# Patient Record
Sex: Female | Born: 1940 | Race: Black or African American | Hispanic: No | Smoking: Former smoker
Health system: Southern US, Community
[De-identification: ages and names within clinical notes are randomized; demographics above are authoritative.]

## PROBLEM LIST (undated history)

## (undated) DIAGNOSIS — E119 Type 2 diabetes mellitus without complications: Secondary | ICD-10-CM

## (undated) DIAGNOSIS — N289 Disorder of kidney and ureter, unspecified: Secondary | ICD-10-CM

## (undated) DIAGNOSIS — R519 Headache, unspecified: Secondary | ICD-10-CM

## (undated) DIAGNOSIS — I480 Paroxysmal atrial fibrillation: Secondary | ICD-10-CM

## (undated) DIAGNOSIS — I10 Essential (primary) hypertension: Secondary | ICD-10-CM

## (undated) HISTORY — DX: Headache, unspecified: R51.9

## (undated) HISTORY — PX: CHOLECYSTECTOMY: SHX55

## (undated) HISTORY — PX: PACEMAKER INSERTION: SHX728

---

## 2016-02-02 ENCOUNTER — Encounter (HOSPITAL_BASED_OUTPATIENT_CLINIC_OR_DEPARTMENT_OTHER): Payer: Self-pay | Admitting: Emergency Medicine

## 2016-02-02 ENCOUNTER — Emergency Department (HOSPITAL_BASED_OUTPATIENT_CLINIC_OR_DEPARTMENT_OTHER)
Admission: EM | Admit: 2016-02-02 | Discharge: 2016-02-02 | Disposition: A | Discharge status: Home / Self Care | Payer: Medicare HMO | Attending: Emergency Medicine | Admitting: Emergency Medicine

## 2016-02-02 DIAGNOSIS — E119 Type 2 diabetes mellitus without complications: Secondary | ICD-10-CM | POA: Insufficient documentation

## 2016-02-02 DIAGNOSIS — R05 Cough: Secondary | ICD-10-CM

## 2016-02-02 DIAGNOSIS — R0981 Nasal congestion: Secondary | ICD-10-CM | POA: Diagnosis not present

## 2016-02-02 DIAGNOSIS — I10 Essential (primary) hypertension: Secondary | ICD-10-CM | POA: Diagnosis not present

## 2016-02-02 DIAGNOSIS — Z87891 Personal history of nicotine dependence: Secondary | ICD-10-CM | POA: Diagnosis not present

## 2016-02-02 DIAGNOSIS — J3489 Other specified disorders of nose and nasal sinuses: Secondary | ICD-10-CM

## 2016-02-02 DIAGNOSIS — R197 Diarrhea, unspecified: Secondary | ICD-10-CM | POA: Diagnosis not present

## 2016-02-02 DIAGNOSIS — R059 Cough, unspecified: Secondary | ICD-10-CM

## 2016-02-02 HISTORY — DX: Disorder of kidney and ureter, unspecified: N28.9

## 2016-02-02 HISTORY — DX: Essential (primary) hypertension: I10

## 2016-02-02 HISTORY — DX: Type 2 diabetes mellitus without complications: E11.9

## 2016-02-02 NOTE — ED Notes (Signed)
EDP at BS, pt seen by EDP prior to RN assessment, see MD notes, pending orders. 

## 2016-02-02 NOTE — ED Triage Notes (Signed)
Pt in c/o diarrhea and "raw throat", states thinks she has a fever but hasn't checked. Pt alert, interactive, ambulatory in NAD.

## 2016-02-02 NOTE — ED Provider Notes (Signed)
[facility] Provider Note   CSN: [id number] Arrival date & time: 02/02/16  1819   By signing my name below, I, Arianna Nassar, attest that this documentation has been prepared under the direction and in the presence of Nira Conn, MD.  Electronically Signed: Octavia Heir, ED Scribe. 02/02/16. 7:58 PM.   History   Chief Complaint Chief Complaint  Patient presents with  . Diarrhea    The history is provided by the patient. No language interpreter was used.   HPI Comments: Summer Harmon is a 75 y.o. female who presents to the Emergency Department complaining of sudden onset, gradual worsening, moderate diarrhea (x4) onset this morning. She notes associated sore throat, productive cough and congestion x 1 day. She further notes shortness of breath which she notes feels as if she has to catch her breath. Pt expresses that she feels very achy. She notes her symptoms are worse when she lays down. Pt says she had abdominal pain but reports it went away after using the bathroom. She says she has been having multiple bowel movements today which have fluctuated between loose and normal BMs.  Pt has been around her grandchildren and husband who have been sick. She denies hx of asthma, ear pain, fever, dysuria, nausea, vomiting or melena. Pt is a former smoker and smoked for about 5 years.   Past Medical History:  Diagnosis Date  . Diabetes mellitus without complication (HCC)   . Hypertension   . Renal disorder     There are no active problems to display for this patient.   Past Surgical History:  Procedure Laterality Date  . CHOLECYSTECTOMY    . PACEMAKER INSERTION      OB History    No data available       Home Medications    Prior to Admission medications   Not on File    Family History History reviewed. No pertinent family history.  Social History Social History  Substance Use Topics  . Smoking status: Former Games developer  . Smokeless tobacco:  Not on file  . Alcohol use No     Allergies   Penicillins and Sulfa antibiotics   Review of Systems Review of Systems  A complete 10 system review of systems was obtained and all systems are negative except as noted in the HPI and PMH.   Physical Exam Updated Vital Signs BP 140/76   Pulse 71   Temp 98.7 F (37.1 C)   Resp 18   Ht 5\' 3"  (1.6 m)   Wt 193 lb (87.5 kg)   SpO2 97%   BMI 34.19 kg/m   Physical Exam  Constitutional: She is oriented to person, place, and time. She appears well-developed and well-nourished. No distress.  HENT:  Head: Normocephalic and atraumatic.  Nose: Nose normal.  Post nasal drip Bilateral nasal mucosa edema Mild erythema to oropharynx  Eyes: Conjunctivae and EOM are normal. Pupils are equal, round, and reactive to light. Right eye exhibits no discharge. Left eye exhibits no discharge. No scleral icterus.  Neck: Normal range of motion. Neck supple.  Cardiovascular: Normal rate and regular rhythm.  Exam reveals no gallop and no friction rub.   No murmur heard. Pulmonary/Chest: Effort normal and breath sounds normal. No stridor. No respiratory distress. She has no rales.  Abdominal: Soft. She exhibits no distension. There is no tenderness.  Musculoskeletal: She exhibits no edema or tenderness.  Neurological: She is alert and oriented to person, place, and time.  Skin: Skin  is warm and dry. No rash noted. She is not diaphoretic. No erythema.  Psychiatric: She has a normal mood and affect.  Nursing note and vitals reviewed.    ED Treatments / Results  DIAGNOSTIC STUDIES: Oxygen Saturation is 97% on RA, normal by my interpretation.  COORDINATION OF CARE:  7:54 PM Discussed treatment plan with pt at bedside and pt agreed to plan.  Labs (all labs ordered are listed, but only abnormal results are displayed) Labs Reviewed - No data to display  EKG  EKG Interpretation None       Radiology No results found.  Procedures Procedures  (including critical care time)  Medications Ordered in ED Medications - No data to display   Initial Impression / Assessment and Plan / ED Course  I have reviewed the triage vital signs and the nursing notes.  Pertinent labs & imaging results that were available during my care of the patient were reviewed by me and considered in my medical decision making (see chart for details).  Clinical Course    75 y.o. female presents with cough, rhinorrhea, and nasal congestion for 1 day. Adequate oral hydration. Pt also with complains of fluctuating bowel habits. Rest of history as above.  Patient appears well. No signs of toxicity. No hypoxia, tachypnea or other signs of respiratory distress. No sign of clinical dehydration. Lung exam clear. Abd benign. Rest of exam as above.  Respiratory symptoms most consistent with viral upper respiratory infection.   No evidence suggestive of pharyngitis, AOM, PNA, or meningitis.  Chest x-ray not indicated at this time.  Bowel habit changes, not consistent with infectious diarrhea. abd benign. Low suspicion for appendicitis, diverticulitis, SBO.  Discussed symptomatic treatment with the parents and they will follow closely with their PCP.     I personally performed the services described in this documentation, which was scribed in my presence. The recorded information has been reviewed and is accurate.      Final Clinical Impressions(s) / ED Diagnoses   Final diagnoses:  Diarrhea, unspecified type  Nasal congestion  Cough  Rhinorrhea   Disposition: Discharge  Condition: Good  I have discussed the results, Dx and Tx plan with the patient who expressed understanding and agree(s) with the plan. Discharge instructions discussed at great length. The patient was given strict return precautions who verbalized understanding of the instructions. No further questions at time of discharge.    There are no discharge medications for this  patient.   Follow Up: Areatha KeasAdrian Vazquez, MD [address] [place] [id number] [phone]  Schedule an appointment as soon as possible for a visit  If symptoms do not improve or  worsen in 3-5 days       Nira ConnPedro Eduardo Kallen Delatorre, MD 02/06/16 [phone]

## 2016-02-08 ENCOUNTER — Telehealth (HOSPITAL_BASED_OUTPATIENT_CLINIC_OR_DEPARTMENT_OTHER): Payer: Self-pay | Admitting: *Deleted

## 2016-02-08 NOTE — Telephone Encounter (Signed)
Fax received from [facility] of [facility] [place] that pt had declined to schedule appointment because office was too far. Chart reviewed by Dr. Judd Lienelo and pt was to follow up with her PCP. Spoke with pt by phone and found that her PCP had a similar name and [place]. Advised pt to f/u as needed and chart updated to reflect correct provider information.

## 2018-09-25 ENCOUNTER — Emergency Department (HOSPITAL_BASED_OUTPATIENT_CLINIC_OR_DEPARTMENT_OTHER)
Admission: EM | Admit: 2018-09-25 | Discharge: 2018-09-25 | Disposition: A | Discharge status: Home / Self Care | Payer: Medicare HMO | Attending: Emergency Medicine | Admitting: Emergency Medicine

## 2018-09-25 ENCOUNTER — Emergency Department (HOSPITAL_BASED_OUTPATIENT_CLINIC_OR_DEPARTMENT_OTHER): Payer: Medicare HMO

## 2018-09-25 ENCOUNTER — Encounter (HOSPITAL_BASED_OUTPATIENT_CLINIC_OR_DEPARTMENT_OTHER): Payer: Self-pay

## 2018-09-25 ENCOUNTER — Other Ambulatory Visit: Payer: Self-pay

## 2018-09-25 DIAGNOSIS — Z20828 Contact with and (suspected) exposure to other viral communicable diseases: Secondary | ICD-10-CM | POA: Diagnosis not present

## 2018-09-25 DIAGNOSIS — N289 Disorder of kidney and ureter, unspecified: Secondary | ICD-10-CM | POA: Diagnosis not present

## 2018-09-25 DIAGNOSIS — Z87891 Personal history of nicotine dependence: Secondary | ICD-10-CM | POA: Diagnosis not present

## 2018-09-25 DIAGNOSIS — Z79899 Other long term (current) drug therapy: Secondary | ICD-10-CM | POA: Insufficient documentation

## 2018-09-25 DIAGNOSIS — E876 Hypokalemia: Secondary | ICD-10-CM | POA: Diagnosis not present

## 2018-09-25 DIAGNOSIS — R42 Dizziness and giddiness: Secondary | ICD-10-CM | POA: Diagnosis present

## 2018-09-25 DIAGNOSIS — I10 Essential (primary) hypertension: Secondary | ICD-10-CM | POA: Insufficient documentation

## 2018-09-25 DIAGNOSIS — E871 Hypo-osmolality and hyponatremia: Secondary | ICD-10-CM | POA: Diagnosis not present

## 2018-09-25 DIAGNOSIS — R110 Nausea: Secondary | ICD-10-CM | POA: Insufficient documentation

## 2018-09-25 DIAGNOSIS — E119 Type 2 diabetes mellitus without complications: Secondary | ICD-10-CM | POA: Diagnosis not present

## 2018-09-25 DIAGNOSIS — E860 Dehydration: Secondary | ICD-10-CM | POA: Diagnosis not present

## 2018-09-25 LAB — COMPREHENSIVE METABOLIC PANEL
ALT: 15 U/L (ref 0–44)
AST: 22 U/L (ref 15–41)
Albumin: 4.4 g/dL (ref 3.5–5.0)
Alkaline Phosphatase: 66 U/L (ref 38–126)
Anion gap: 11 (ref 5–15)
BUN: 20 mg/dL (ref 8–23)
CO2: 29 mmol/L (ref 22–32)
Calcium: 10.1 mg/dL (ref 8.9–10.3)
Chloride: 91 mmol/L — ABNORMAL LOW (ref 98–111)
Creatinine, Ser: 1.67 mg/dL — ABNORMAL HIGH (ref 0.44–1.00)
GFR calc Af Amer: 33 mL/min — ABNORMAL LOW (ref >60)
GFR calc non Af Amer: 29 mL/min — ABNORMAL LOW (ref >60)
Glucose, Bld: 104 mg/dL — ABNORMAL HIGH (ref 70–99)
Potassium: 3.1 mmol/L — ABNORMAL LOW (ref 3.5–5.1)
Sodium: 131 mmol/L — ABNORMAL LOW (ref 135–145)
Total Bilirubin: 1.1 mg/dL (ref 0.3–1.2)
Total Protein: 7.9 g/dL (ref 6.5–8.1)

## 2018-09-25 LAB — CBC WITH DIFFERENTIAL/PLATELET
Abs Immature Granulocytes: 0.01 10*3/uL (ref 0.00–0.07)
Basophils Absolute: 0 10*3/uL (ref 0.0–0.1)
Basophils Relative: 1 %
Eosinophils Absolute: 0.1 10*3/uL (ref 0.0–0.5)
Eosinophils Relative: 1 %
HCT: 38.7 % (ref 36.0–46.0)
Hemoglobin: 12.7 g/dL (ref 12.0–15.0)
Immature Granulocytes: 0 %
Lymphocytes Relative: 42 %
Lymphs Abs: 1.7 10*3/uL (ref 0.7–4.0)
MCH: 27.9 pg (ref 26.0–34.0)
MCHC: 32.8 g/dL (ref 30.0–36.0)
MCV: 85.1 fL (ref 80.0–100.0)
Monocytes Absolute: 0.5 10*3/uL (ref 0.1–1.0)
Monocytes Relative: 13 %
Neutro Abs: 1.7 10*3/uL (ref 1.7–7.7)
Neutrophils Relative %: 43 %
Platelets: 313 10*3/uL (ref 150–400)
RBC: 4.55 MIL/uL (ref 3.87–5.11)
RDW: 14 % (ref 11.5–15.5)
WBC: 4 10*3/uL (ref 4.0–10.5)
nRBC: 0 % (ref 0.0–0.2)

## 2018-09-25 LAB — URINALYSIS, ROUTINE W REFLEX MICROSCOPIC
Bilirubin Urine: NEGATIVE
Glucose, UA: NEGATIVE mg/dL
Ketones, ur: NEGATIVE mg/dL
Leukocytes,Ua: NEGATIVE
Nitrite: NEGATIVE
Protein, ur: NEGATIVE mg/dL
Specific Gravity, Urine: <1.005 — ABNORMAL LOW (ref 1.005–1.030)
pH: 6.5 (ref 5.0–8.0)

## 2018-09-25 LAB — URINALYSIS, MICROSCOPIC (REFLEX)

## 2018-09-25 LAB — CBG MONITORING, ED: Glucose-Capillary: 102 mg/dL — ABNORMAL HIGH (ref 70–99)

## 2018-09-25 MED ORDER — POTASSIUM CHLORIDE CRYS ER 20 MEQ PO TBCR
40.0000 meq | EXTENDED_RELEASE_TABLET | Freq: Once | ORAL | Status: AC
  Administered 2018-09-25: 40 meq via ORAL
  Filled 2018-09-25: qty 2

## 2018-09-25 NOTE — ED Triage Notes (Signed)
Pt presents with intermittent HA/ dizziness/weakness x 1 month. Pt ambulatory to triage. Pt denies SOB-

## 2018-09-25 NOTE — ED Notes (Signed)
Patient transported to X-ray and returned 

## 2018-09-25 NOTE — ED Provider Notes (Signed)
[facility] EMERGENCY DEPARTMENT Provider Note   CSN: [id number] Arrival date & time: 09/25/18  1657    History   Chief Complaint Chief Complaint  Patient presents with  . Dizziness    HPI Summer Harmon is a 78 y.o. female.     The history is provided by the patient and medical records. No language interpreter was used.  Dizziness   Summer Harmon is a 78 y.o. female who presents to the Emergency Department complaining of chills, malaise.  She complains of feeling poorly for the last month with chills and feeling hot.  She has checked her temperature and denies fever.  Denies chest pain, sob, abdominal pain, vomiting, dysuria, diarrhea. She has some headaches at night when she lies down but none currently.  She has increased fatigue.  She has a cough, worse at night.  She is nauseous, constipated.  She thinks she has coronavirus.  She has no known exposures to the virus.  She has lost weight, but not sure how much or over how much time.  She lives alone.  Sxs are mild to moderate, worsening.   Past Medical History:  Diagnosis Date  . Diabetes mellitus without complication (HCC)   . Hypertension   . Renal disorder     There are no active problems to display for this patient.   Past Surgical History:  Procedure Laterality Date  . CHOLECYSTECTOMY    . PACEMAKER INSERTION       OB History   No obstetric history on file.      Home Medications    Prior to Admission medications   Medication Sig Start Date End Date Taking? Authorizing Provider  cloNIDine (CATAPRES) 0.2 MG tablet Take 0.2 mg by mouth 3 (three) times daily.   Yes [provider]  lovastatin (MEVACOR) 10 MG tablet Take 40 mg by mouth daily.   Yes [provider]    Family History No family history on file.  Social History Social History   Tobacco Use  . Smoking status: Former Games developer  . Smokeless tobacco: Never Used  Substance Use Topics  . Alcohol use: No  . Drug  use: Never     Allergies   Penicillins and Sulfa antibiotics   Review of Systems Review of Systems  Neurological: Positive for dizziness.  All other systems reviewed and are negative.    Physical Exam Updated Vital Signs BP (!) 152/82   Pulse 76   Temp 98.5 F (36.9 C) (Oral)   Resp 17   SpO2 99%   Physical Exam Vitals signs and nursing note reviewed.  Constitutional:      Appearance: She is well-developed.  HENT:     Head: Normocephalic and atraumatic.  Cardiovascular:     Rate and Rhythm: Normal rate and regular rhythm.     Heart sounds: No murmur.  Pulmonary:     Effort: Pulmonary effort is normal. No respiratory distress.     Breath sounds: Normal breath sounds.  Abdominal:     Palpations: Abdomen is soft.     Tenderness: There is no abdominal tenderness. There is no guarding or rebound.  Musculoskeletal:        General: No tenderness.     Comments: Trace non pitting edema  Skin:    General: Skin is warm and dry.     Capillary Refill: Capillary refill takes less than 2 seconds.  Neurological:     Mental Status: She is alert and oriented to person, place, and  time.  Psychiatric:        Behavior: Behavior normal.      ED Treatments / Results  Labs (all labs ordered are listed, but only abnormal results are displayed) Labs Reviewed  URINALYSIS, ROUTINE W REFLEX MICROSCOPIC - Abnormal; Notable for the following components:      Result Value   Specific Gravity, Urine <1.005 (*)    Hgb urine dipstick TRACE (*)    All other components within normal limits  COMPREHENSIVE METABOLIC PANEL - Abnormal; Notable for the following components:   Sodium 131 (*)    Potassium 3.1 (*)    Chloride 91 (*)    Glucose, Bld 104 (*)    Creatinine, Ser 1.67 (*)    GFR calc non Af Amer 29 (*)    GFR calc Af Amer 33 (*)    All other components within normal limits  URINALYSIS, MICROSCOPIC (REFLEX) - Abnormal; Notable for the following components:   Bacteria, UA RARE (*)     All other components within normal limits  CBG MONITORING, ED - Abnormal; Notable for the following components:   Glucose-Capillary 102 (*)    All other components within normal limits  NOVEL CORONAVIRUS, NAA (HOSPITAL ORDER, SEND-OUT TO REF LAB)  CBC WITH DIFFERENTIAL/PLATELET    EKG EKG Interpretation  Date/Time:  Saturday Sep 25 2018 18:18:58 EDT Ventricular Rate:  77 PR Interval:    QRS Duration: 131 QT Interval:  425 QTC Calculation: 481 R Axis:   3 Text Interpretation:  Sinus rhythm Right atrial enlargement Left ventricular hypertrophy Artifact in lead(s) I III aVR aVL V1 V2 no prior available for comparison Confirmed by Tilden Fossa [phone]) on 09/25/2018 6:59:27 PM   Radiology Dg Chest 2 View  Result Date: 09/25/2018 CLINICAL DATA:  One month history of intermittent headaches associated with dizziness and generalized weakness. Current history of hypertension and diabetes. EXAM: CHEST - 2 VIEW COMPARISON:  None. FINDINGS: Cardiac silhouette normal in size. LEFT subclavian dual lead transvenous pacemaker with the lead tips at the expected location of the RIGHT atrial appendage and RIGHT ventricular apex. Thoracic aorta mildly tortuous. Hilar and mediastinal contours otherwise unremarkable. Lungs clear. Bronchovascular markings normal. Pulmonary vascularity normal. No visible pleural effusions. No pneumothorax. Degenerative changes throughout the thoracic spine. IMPRESSION: No acute cardiopulmonary disease. Electronically Signed   By: Hulan Saas M.D.   On: 09/25/2018 19:55    Procedures Procedures (including critical care time)  Medications Ordered in ED Medications  potassium chloride SA (K-DUR) CR tablet 40 mEq (has no administration in time range)     Initial Impression / Assessment and Plan / ED Course  I have reviewed the triage vital signs and the nursing notes.  Pertinent labs & imaging results that were available during my care of the patient were  reviewed by me and considered in my medical decision making (see chart for details).        Pt here for evaluation of nausea, fatigue and malaise. She is non-toxic appearing on evaluation with no acute distress. She is concerned that she has coronavirus infection but she has no known sick contacts, current constellation of symptoms is not consistent with coronavirus. Will send testing per patient request given her comorbidities. Current presentation is not consistent with ACS, bowel obstruction, CVA, pneumonia, UTI, acute CHF. Labs demonstrate stable renal insufficiency with new hyponatremia, hypokalemia. Discussed with patient oral fluid hydration, she is hydrating in the emergency department. Discussed importance of outpatient follow-up as well as return precautions.  Summer Harmon was evaluated in Emergency Department on 09/25/2018 for the symptoms described in the history of present illness. She was evaluated in the context of the global COVID-19 pandemic, which necessitated consideration that the patient might be at risk for infection with the SARS-CoV-2 virus that causes COVID-19. Institutional protocols and algorithms that pertain to the evaluation of patients at risk for COVID-19 are in a state of rapid change based on information released by regulatory bodies including the CDC and federal and state organizations. These policies and algorithms were followed during the patient's care in the ED.   Final Clinical Impressions(s) / ED Diagnoses   Final diagnoses:  Nausea  Dehydration  Hyponatremia  Hypokalemia    ED Discharge Orders    None       Tilden Fossaees, [place], MD 09/25/18 2010

## 2018-09-25 NOTE — ED Notes (Signed)
Dr. Madilyn Hook at bedside. Pt's son updated on plan of care

## 2018-09-27 LAB — NOVEL CORONAVIRUS, NAA (HOSP ORDER, SEND-OUT TO REF LAB; TAT 18-24 HRS): SARS-CoV-2, NAA: NOT DETECTED

## 2018-10-14 ENCOUNTER — Emergency Department (HOSPITAL_BASED_OUTPATIENT_CLINIC_OR_DEPARTMENT_OTHER)
Admission: EM | Admit: 2018-10-14 | Discharge: 2018-10-14 | Disposition: A | Discharge status: Home / Self Care | Payer: Medicare HMO | Attending: Emergency Medicine | Admitting: Emergency Medicine

## 2018-10-14 ENCOUNTER — Other Ambulatory Visit: Payer: Self-pay

## 2018-10-14 ENCOUNTER — Encounter (HOSPITAL_BASED_OUTPATIENT_CLINIC_OR_DEPARTMENT_OTHER): Payer: Self-pay

## 2018-10-14 DIAGNOSIS — E860 Dehydration: Secondary | ICD-10-CM | POA: Insufficient documentation

## 2018-10-14 DIAGNOSIS — Z79899 Other long term (current) drug therapy: Secondary | ICD-10-CM | POA: Insufficient documentation

## 2018-10-14 DIAGNOSIS — I10 Essential (primary) hypertension: Secondary | ICD-10-CM | POA: Diagnosis not present

## 2018-10-14 DIAGNOSIS — E119 Type 2 diabetes mellitus without complications: Secondary | ICD-10-CM | POA: Insufficient documentation

## 2018-10-14 DIAGNOSIS — R42 Dizziness and giddiness: Secondary | ICD-10-CM

## 2018-10-14 DIAGNOSIS — Z87891 Personal history of nicotine dependence: Secondary | ICD-10-CM | POA: Insufficient documentation

## 2018-10-14 LAB — URINALYSIS, ROUTINE W REFLEX MICROSCOPIC
Bilirubin Urine: NEGATIVE
Glucose, UA: 100 mg/dL — AB
Hgb urine dipstick: NEGATIVE
Ketones, ur: NEGATIVE mg/dL
Leukocytes,Ua: NEGATIVE
Nitrite: NEGATIVE
Protein, ur: NEGATIVE mg/dL
Specific Gravity, Urine: 1.01 (ref 1.005–1.030)
pH: 7 (ref 5.0–8.0)

## 2018-10-14 LAB — COMPREHENSIVE METABOLIC PANEL
ALT: 19 U/L (ref 0–44)
AST: 29 U/L (ref 15–41)
Albumin: 4.2 g/dL (ref 3.5–5.0)
Alkaline Phosphatase: 71 U/L (ref 38–126)
Anion gap: 9 (ref 5–15)
BUN: 20 mg/dL (ref 8–23)
CO2: 25 mmol/L (ref 22–32)
Calcium: 10 mg/dL (ref 8.9–10.3)
Chloride: 102 mmol/L (ref 98–111)
Creatinine, Ser: 1.63 mg/dL — ABNORMAL HIGH (ref 0.44–1.00)
GFR calc Af Amer: 34 mL/min — ABNORMAL LOW (ref >60)
GFR calc non Af Amer: 30 mL/min — ABNORMAL LOW (ref >60)
Glucose, Bld: 104 mg/dL — ABNORMAL HIGH (ref 70–99)
Potassium: 3.9 mmol/L (ref 3.5–5.1)
Sodium: 136 mmol/L (ref 135–145)
Total Bilirubin: 1.1 mg/dL (ref 0.3–1.2)
Total Protein: 7.9 g/dL (ref 6.5–8.1)

## 2018-10-14 LAB — CBC
HCT: 40.6 % (ref 36.0–46.0)
Hemoglobin: 12.9 g/dL (ref 12.0–15.0)
MCH: 27.6 pg (ref 26.0–34.0)
MCHC: 31.8 g/dL (ref 30.0–36.0)
MCV: 86.8 fL (ref 80.0–100.0)
Platelets: 301 10*3/uL (ref 150–400)
RBC: 4.68 MIL/uL (ref 3.87–5.11)
RDW: 14.9 % (ref 11.5–15.5)
WBC: 3.1 10*3/uL — ABNORMAL LOW (ref 4.0–10.5)
nRBC: 0 % (ref 0.0–0.2)

## 2018-10-14 MED ORDER — SODIUM CHLORIDE 0.9 % IV BOLUS
500.0000 mL | Freq: Once | INTRAVENOUS | Status: AC
  Administered 2018-10-14: 13:00:00 500 mL via INTRAVENOUS

## 2018-10-14 MED ORDER — SODIUM CHLORIDE 0.9 % IV BOLUS
1000.0000 mL | Freq: Once | INTRAVENOUS | Status: AC
  Administered 2018-10-14: 1000 mL via INTRAVENOUS

## 2018-10-14 NOTE — ED Notes (Signed)
Pt. Said she has not taken her B/P meds today and will do so on arrival to home.

## 2018-10-14 NOTE — ED Notes (Signed)
Pt assisted to bathroom.  Gait steady but pt does describe concerns of lightheadedness with change in position.

## 2018-10-14 NOTE — ED Notes (Signed)
Patient denies pain and is resting comfortably.  

## 2018-10-14 NOTE — ED Notes (Signed)
Pt on monitor 

## 2018-10-14 NOTE — ED Provider Notes (Signed)
[facility] EMERGENCY DEPARTMENT Provider Note   CSN: [id number] Arrival date & time: 10/14/18  1150    History   Chief Complaint Chief Complaint  Patient presents with  . Dizziness    HPI Summer Harmon is a [age] y.o. female.     HPI 78 year old female presents the emergency department with complaints of lightheadedness and dizziness when standing up over the past several days.  She feels like she is been eating and drinking normally.  Denies fevers and chills.  No cough or congestion.  Denies abdominal pain.  No blood in her stool or melanotic stool described.  Denies abdominal pain.  She states she has had these issues intermittently over the past year and usually responds IV fluids.  No other new medications.   Past Medical History:  Diagnosis Date  . Diabetes mellitus without complication (HCC)   . Hypertension   . Renal disorder     There are no active problems to display for this patient.   Past Surgical History:  Procedure Laterality Date  . CHOLECYSTECTOMY    . PACEMAKER INSERTION       OB History   No obstetric history on file.      Home Medications    Prior to Admission medications   Medication Sig Start Date End Date Taking? Authorizing Provider  cloNIDine (CATAPRES) 0.2 MG tablet Take 0.2 mg by mouth 3 (three) times daily.    [provider]  lovastatin (MEVACOR) 10 MG tablet Take 40 mg by mouth daily.    [provider]    Family History No family history on file.  Social History Social History   Tobacco Use  . Smoking status: Former Games developermoker  . Smokeless tobacco: Never Used  Substance Use Topics  . Alcohol use: No  . Drug use: Never     Allergies   Penicillins and Sulfa antibiotics   Review of Systems Review of Systems  All other systems reviewed and are negative.    Physical Exam Updated Vital Signs BP 92/68 (BP Location: Left Arm)   Pulse 77   Temp 98.3 F (36.8 C) (Oral)   Resp 16   Ht 5'  3" (1.6 m)   Wt 73.9 kg   SpO2 100%   BMI 28.87 kg/m   Physical Exam Vitals signs and nursing note reviewed.  Constitutional:      General: She is not in acute distress.    Appearance: She is well-developed.  HENT:     Head: Normocephalic and atraumatic.  Neck:     Musculoskeletal: Normal range of motion.  Cardiovascular:     Rate and Rhythm: Normal rate and regular rhythm.     Heart sounds: Normal heart sounds.  Pulmonary:     Effort: Pulmonary effort is normal.     Breath sounds: Normal breath sounds.  Abdominal:     General: There is no distension.     Palpations: Abdomen is soft.     Tenderness: There is no abdominal tenderness.  Musculoskeletal: Normal range of motion.  Skin:    General: Skin is warm and dry.  Neurological:     Mental Status: She is alert and oriented to person, place, and time.  Psychiatric:        Judgment: Judgment normal.      ED Treatments / Results  Labs (all labs ordered are listed, but only abnormal results are displayed) Labs Reviewed  CBC - Abnormal; Notable for the following components:  Result Value   WBC 3.1 (*)    All other components within normal limits  COMPREHENSIVE METABOLIC PANEL - Abnormal; Notable for the following components:   Glucose, Bld 104 (*)    Creatinine, Ser 1.63 (*)    GFR calc non Af Amer 30 (*)    GFR calc Af Amer 34 (*)    All other components within normal limits  URINALYSIS, ROUTINE W REFLEX MICROSCOPIC - Abnormal; Notable for the following components:   Glucose, UA 100 (*)    All other components within normal limits    EKG None  Radiology No results found.  Procedures Procedures (including critical care time)  Medications Ordered in ED Medications  sodium chloride 0.9 % bolus 500 mL (0 mLs Intravenous Stopped 10/14/18 1421)  sodium chloride 0.9 % bolus 1,000 mL (1,000 mLs Intravenous New Bag/Given 10/14/18 1421)     Initial Impression / Assessment and Plan / ED Course  I have reviewed  the triage vital signs and the nursing notes.  Pertinent labs & imaging results that were available during my care of the patient were reviewed by me and considered in my medical decision making (see chart for details).        3:03 PM Feels better after IV fluids.  Orthostatic vital signs.  Patient will follow-up with her primary care team regarding intermittent orthostasis over the past year.  No change to her medications.  This is best managed by her primary care team.  Feels better.  Discharged home in good condition.  No indication for additional work-up or treatment at this time.  Overall well-appearing.  Final Clinical Impressions(s) / ED Diagnoses   Final diagnoses:  None    ED Discharge Orders    None       Azalia Bilis, MD 10/14/18 [phone]

## 2018-10-14 NOTE — ED Triage Notes (Addendum)
Pt c/o feeling dizzy ~8am-states "it's hard to say" when asked if she woke with dizziness-BP at home was elevated then too low per pt-to triage in w/c-NAD

## 2018-11-17 ENCOUNTER — Emergency Department (HOSPITAL_BASED_OUTPATIENT_CLINIC_OR_DEPARTMENT_OTHER)
Admission: EM | Admit: 2018-11-17 | Discharge: 2018-11-17 | Discharge status: Against Medical Advice | Payer: Medicare HMO | Attending: Emergency Medicine | Admitting: Emergency Medicine

## 2018-11-17 ENCOUNTER — Emergency Department (HOSPITAL_BASED_OUTPATIENT_CLINIC_OR_DEPARTMENT_OTHER): Payer: Medicare HMO

## 2018-11-17 ENCOUNTER — Other Ambulatory Visit: Payer: Self-pay

## 2018-11-17 ENCOUNTER — Encounter (HOSPITAL_BASED_OUTPATIENT_CLINIC_OR_DEPARTMENT_OTHER): Payer: Self-pay

## 2018-11-17 DIAGNOSIS — I129 Hypertensive chronic kidney disease with stage 1 through stage 4 chronic kidney disease, or unspecified chronic kidney disease: Secondary | ICD-10-CM | POA: Diagnosis not present

## 2018-11-17 DIAGNOSIS — N183 Chronic kidney disease, stage 3 unspecified: Secondary | ICD-10-CM

## 2018-11-17 DIAGNOSIS — I951 Orthostatic hypotension: Secondary | ICD-10-CM | POA: Insufficient documentation

## 2018-11-17 DIAGNOSIS — Z79899 Other long term (current) drug therapy: Secondary | ICD-10-CM | POA: Diagnosis not present

## 2018-11-17 DIAGNOSIS — R531 Weakness: Secondary | ICD-10-CM | POA: Diagnosis present

## 2018-11-17 DIAGNOSIS — E1122 Type 2 diabetes mellitus with diabetic chronic kidney disease: Secondary | ICD-10-CM | POA: Insufficient documentation

## 2018-11-17 DIAGNOSIS — Z5320 Procedure and treatment not carried out because of patient's decision for unspecified reasons: Secondary | ICD-10-CM | POA: Diagnosis not present

## 2018-11-17 DIAGNOSIS — Z87891 Personal history of nicotine dependence: Secondary | ICD-10-CM | POA: Insufficient documentation

## 2018-11-17 DIAGNOSIS — Z950 Presence of cardiac pacemaker: Secondary | ICD-10-CM | POA: Insufficient documentation

## 2018-11-17 LAB — URINALYSIS, ROUTINE W REFLEX MICROSCOPIC
Bilirubin Urine: NEGATIVE
Glucose, UA: 100 mg/dL — AB
Hgb urine dipstick: NEGATIVE
Ketones, ur: NEGATIVE mg/dL
Leukocytes,Ua: NEGATIVE
Nitrite: NEGATIVE
Protein, ur: NEGATIVE mg/dL
Specific Gravity, Urine: <1.005 — ABNORMAL LOW (ref 1.005–1.030)
pH: 7 (ref 5.0–8.0)

## 2018-11-17 LAB — CBC WITH DIFFERENTIAL/PLATELET
Abs Immature Granulocytes: 0 10*3/uL (ref 0.00–0.07)
Basophils Absolute: 0 10*3/uL (ref 0.0–0.1)
Basophils Relative: 1 %
Eosinophils Absolute: 0.1 10*3/uL (ref 0.0–0.5)
Eosinophils Relative: 2 %
HCT: 40.3 % (ref 36.0–46.0)
Hemoglobin: 13.2 g/dL (ref 12.0–15.0)
Immature Granulocytes: 0 %
Lymphocytes Relative: 44 %
Lymphs Abs: 1.5 10*3/uL (ref 0.7–4.0)
MCH: 28.1 pg (ref 26.0–34.0)
MCHC: 32.8 g/dL (ref 30.0–36.0)
MCV: 85.9 fL (ref 80.0–100.0)
Monocytes Absolute: 0.4 10*3/uL (ref 0.1–1.0)
Monocytes Relative: 14 %
Neutro Abs: 1.3 10*3/uL — ABNORMAL LOW (ref 1.7–7.7)
Neutrophils Relative %: 39 %
Platelets: 253 10*3/uL (ref 150–400)
RBC: 4.69 MIL/uL (ref 3.87–5.11)
RDW: 14.3 % (ref 11.5–15.5)
WBC: 3.2 10*3/uL — ABNORMAL LOW (ref 4.0–10.5)
nRBC: 0 % (ref 0.0–0.2)

## 2018-11-17 LAB — BASIC METABOLIC PANEL
Anion gap: 11 (ref 5–15)
BUN: 25 mg/dL — ABNORMAL HIGH (ref 8–23)
CO2: 27 mmol/L (ref 22–32)
Calcium: 10.2 mg/dL (ref 8.9–10.3)
Chloride: 98 mmol/L (ref 98–111)
Creatinine, Ser: 1.74 mg/dL — ABNORMAL HIGH (ref 0.44–1.00)
GFR calc Af Amer: 32 mL/min — ABNORMAL LOW (ref >60)
GFR calc non Af Amer: 27 mL/min — ABNORMAL LOW (ref >60)
Glucose, Bld: 121 mg/dL — ABNORMAL HIGH (ref 70–99)
Potassium: 3.3 mmol/L — ABNORMAL LOW (ref 3.5–5.1)
Sodium: 136 mmol/L (ref 135–145)

## 2018-11-17 LAB — CBG MONITORING, ED: Glucose-Capillary: 109 mg/dL — ABNORMAL HIGH (ref 70–99)

## 2018-11-17 MED ORDER — SODIUM CHLORIDE 0.9 % IV BOLUS
1000.0000 mL | Freq: Once | INTRAVENOUS | Status: AC
  Administered 2018-11-17: 1000 mL via INTRAVENOUS

## 2018-11-17 MED ORDER — ACETAMINOPHEN 325 MG PO TABS
650.0000 mg | ORAL_TABLET | Freq: Once | ORAL | Status: AC
  Administered 2018-11-17: 650 mg via ORAL
  Filled 2018-11-17: qty 2

## 2018-11-17 MED ORDER — SODIUM CHLORIDE 0.9 % IV SOLN: INTRAVENOUS | Status: DC

## 2018-11-17 NOTE — ED Notes (Signed)
Pt states feeling better now once arrived in room.  Stated she removed the clonidine patch this morning that she had applied yesterday.

## 2018-11-17 NOTE — ED Notes (Signed)
Provider made aware of orthostatic b/p result. Pt states does not use a walker or bedside commode at home.  Has approx 15 feet to walk while at home, has grandson that helps her at times but does not live with her.  Has access to a wheelchair that belonged to her husband if she needs it but she has not yet needed to utilize it.

## 2018-11-17 NOTE — ED Notes (Signed)
[employer] Rep completed pacemaker interrogation, No issues discovered.  Report provided to MD

## 2018-11-17 NOTE — ED Provider Notes (Signed)
Patient handed off to me at 3 PM by Dr. Maren Beach. Patient here for orthostatic hypotension.  Started recently on clonidine blood pressure patch.  Patient getting hydration, pacemaker to be evaluated.  Thus far work-up unremarkable.  Plan is to hydrate and ambulate and anticipate discharge to home.  Patient will DC clonidine patch.  Will reevaluate after pacemaker has been evaluated and after patient has gotten fluid bolus.  Pacemaker evaluated without any issues.  Patient felt better after IV fluids.  However still with positive orthostatics.  Patient does not want to be admitted.  She states that her sister is a Marine scientist, she has a wheelchair at home and states that she has enough support at home and will follow-up with her primary care doctor tomorrow.  Talk to her about the risks and benefits of staying.  Told her that she is a high risk of falling and causing injury to herself.  However patient does not want to stay in the hospital.  Blood pressure was normal throughout my care.  When she did stand up blood pressure dropped but was still 856 systolic.  She has improved but still feel as if she would benefit from staying in the hospital for stabilization.  She understands the risks and benefits and wants to leave [facility].   This chart was dictated using voice recognition software.  Despite best efforts to proofread,  errors can occur which can change the documentation meaning.     Lennice Sites, DO 11/17/18 1611

## 2018-11-17 NOTE — ED Notes (Signed)
[employer]  Notified of need for interigation  [phone]  Spoke with Ernest Haber reports tech will call will arrival time

## 2018-11-17 NOTE — ED Notes (Signed)
Patient transported to X-ray 

## 2018-11-17 NOTE — ED Notes (Signed)
ED Provider at bedside. 

## 2018-11-17 NOTE — ED Notes (Signed)
Pt aware of need for urine specimen. 

## 2018-11-17 NOTE — ED Triage Notes (Addendum)
Pt brought to triage via w/c-pt will not give example of why she is here keeps repeating "I feel bad"-pt appears angry and yells at staff-when asked if she is in pain, having cough, fever, n/v/d-pt yells "no I just feel bad"-asked to have son that brought pt in to come back inside into triage-son states BP has been too high and too low-pt with new clonidine patch started yesterday-EMS was called this am-pt states she called them because she was in the [place] and "I couldn't get up-I was too dizzy and weak"-pt NAD-taken to tx area via w/c

## 2018-11-17 NOTE — ED Notes (Signed)
[employer] Rep to interrogate pacemaker

## 2018-11-17 NOTE — Discharge Instructions (Addendum)
Hold the clonidine patch.

## 2018-11-17 NOTE — ED Provider Notes (Signed)
[facility] EMERGENCY DEPARTMENT Provider Note   CSN: [id number] Arrival date & time: 11/17/18  1120    History   Chief Complaint Chief Complaint  Patient presents with  . Weakness    HPI Summer Harmon is a 78 y.o. female.     Pt presents to the ED today not feeling well.  She has a hx of HTN and CKD.  Her diltiazem was stopped and metoprolol was increased.  The pt's HCTZ was stopped due to electrolyte abn.  The pt has been tapered off her oral clonidine (she's down to 0.1 mg daily) as well.  The pt's pcp started her on clonidine patch (0.3 mg).  This was started yesterday.  Pt's son called pcp to tell them that she passed out last night.  Pt denies this and said she was fully alert.  This morning, she felt too weak to get up.  She denies any f/c.  No sob.  No cough.  No new pains.     Past Medical History:  Diagnosis Date  . Diabetes mellitus without complication (HCC)   . Hypertension   . Renal disorder     There are no active problems to display for this patient.   Past Surgical History:  Procedure Laterality Date  . CHOLECYSTECTOMY    . PACEMAKER INSERTION       OB History   No obstetric history on file.      Home Medications    Prior to Admission medications   Medication Sig Start Date End Date Taking? Authorizing Provider  cloNIDine (CATAPRES) 0.2 MG tablet Take 0.2 mg by mouth 3 (three) times daily.    [provider]  lovastatin (MEVACOR) 10 MG tablet Take 40 mg by mouth daily.    [provider]    Family History No family history on file.  Social History Social History   Tobacco Use  . Smoking status: Former Games developermoker  . Smokeless tobacco: Never Used  Substance Use Topics  . Alcohol use: No  . Drug use: Never     Allergies   Penicillins and Sulfa antibiotics   Review of Systems Review of Systems  Neurological: Positive for syncope and weakness.  All other systems reviewed and are negative.     Physical Exam Updated Vital Signs BP 109/80   Pulse 75   Temp 98 F (36.7 C) (Oral)   Resp 15   Ht 5\' 2"  (1.575 m)   Wt 73 kg   SpO2 96%   BMI 29.45 kg/m   Physical Exam Vitals signs and nursing note reviewed.  Constitutional:      Appearance: Normal appearance.  HENT:     Head: Normocephalic and atraumatic.     Right Ear: External ear normal.     Left Ear: External ear normal.     Nose: Nose normal.     Mouth/Throat:     Mouth: Mucous membranes are moist.     Pharynx: Oropharynx is clear.  Eyes:     Extraocular Movements: Extraocular movements intact.     Conjunctiva/sclera: Conjunctivae normal.     Pupils: Pupils are equal, round, and reactive to light.  Neck:     Musculoskeletal: Normal range of motion and neck supple.  Cardiovascular:     Rate and Rhythm: Normal rate and regular rhythm.     Pulses: Normal pulses.     Heart sounds: Normal heart sounds.  Pulmonary:     Effort: Pulmonary effort is normal.  Breath sounds: Normal breath sounds.  Abdominal:     General: Abdomen is flat. Bowel sounds are normal.     Palpations: Abdomen is soft.  Musculoskeletal: Normal range of motion.  Skin:    General: Skin is warm and dry.     Capillary Refill: Capillary refill takes less than 2 seconds.  Neurological:     General: No focal deficit present.     Mental Status: She is alert and oriented to person, place, and time.  Psychiatric:        Mood and Affect: Mood normal.        Behavior: Behavior normal.        Thought Content: Thought content normal.        Judgment: Judgment normal.      ED Treatments / Results  Labs (all labs ordered are listed, but only abnormal results are displayed) Labs Reviewed  BASIC METABOLIC PANEL - Abnormal; Notable for the following components:      Result Value   Potassium 3.3 (*)    Glucose, Bld 121 (*)    BUN 25 (*)    Creatinine, Ser 1.74 (*)    GFR calc non Af Amer 27 (*)    GFR calc Af Amer 32 (*)    All other  components within normal limits  CBC WITH DIFFERENTIAL/PLATELET - Abnormal; Notable for the following components:   WBC 3.2 (*)    Neutro Abs 1.3 (*)    All other components within normal limits  URINALYSIS, ROUTINE W REFLEX MICROSCOPIC - Abnormal; Notable for the following components:   Specific Gravity, Urine <1.005 (*)    Glucose, UA 100 (*)    All other components within normal limits  CBG MONITORING, ED - Abnormal; Notable for the following components:   Glucose-Capillary 109 (*)    All other components within normal limits    EKG EKG Interpretation  Date/Time:  Wednesday November 17 2018 12:12:05 EDT Ventricular Rate:  76 PR Interval:    QRS Duration: 112 QT Interval:  428 QTC Calculation: 482 R Axis:   7 Text Interpretation:  Atrial-paced complexes LVH with secondary repolarization abnormality Confirmed by Isla Pence [phone]) on 11/17/2018 12:56:21 PM   Radiology Dg Chest 2 View  Result Date: 11/17/2018 CLINICAL DATA:  The patient reports feeling ill. EXAM: CHEST - 2 VIEW COMPARISON:  PA and lateral chest 07/26/2018 FINDINGS: The lungs are clear. Pacing device is noted. Heart size is normal. No pneumothorax or pleural effusion. No acute bony abnormality. Thoracic spondylosis is seen. IMPRESSION: No acute disease. Electronically Signed   By: Inge Rise M.D.   On: 11/17/2018 13:06    Procedures Procedures (including critical care time)  Medications Ordered in ED Medications  sodium chloride 0.9 % bolus 1,000 mL (0 mLs Intravenous Stopped 11/17/18 1357)    And  0.9 %  sodium chloride infusion (has no administration in time range)  acetaminophen (TYLENOL) tablet 650 mg (650 mg Oral Given 11/17/18 1251)  sodium chloride 0.9 % bolus 1,000 mL (1,000 mLs Intravenous New Bag/Given 11/17/18 1438)     Initial Impression / Assessment and Plan / ED Course  I have reviewed the triage vital signs and the nursing notes.  Pertinent labs & imaging results that were available during  my care of the patient were reviewed by me and considered in my medical decision making (see chart for details).   Patch removed by pt this morning.   Pt has very labile blood pressure and is orthostatic  while here and could not stand up.  She remains orthostatic after 1L, so she was given an additional L.   Initial sbp 86.  BP went up to 194/114 but then back down to 122/89.  I am going to get pt's pacemaker interrogated, but a tech has to come out, so this is pending.  Pt is told to hold the clonidine patch.  She is to f/u with her pcp regarding any further bp medication changes.  Pt signed out to Dr. Lockie Molauratolo pending orthostatic improvement and pacemaker check.   Final Clinical Impressions(s) / ED Diagnoses   Final diagnoses:  Orthostatic hypotension  CKD (chronic kidney disease) stage 3, GFR 30-59 ml/min [facility](HCC)    ED Discharge Orders    None       Jacalyn LefevreHaviland, Darrly Loberg, MD 11/17/18 1441

## 2018-11-17 NOTE — ED Notes (Signed)
Pt's son declined to stay at pt's BS-states he will be in car-phone # in chart for contact and he states he has signed up for texts with reg clerk

## 2018-11-17 NOTE — ED Notes (Addendum)
Pt discharged home insists does not wish to be admitted to the hospital. Pt to be discharged against medical advice.  Discharge instructions discussed with family member Grandson in lobby.  Pt and family verbalize understanding of instructions for follow up with pmd tomorrow, and to hold b/p medication tonight.

## 2018-11-24 ENCOUNTER — Emergency Department (HOSPITAL_BASED_OUTPATIENT_CLINIC_OR_DEPARTMENT_OTHER): Payer: Medicare HMO

## 2018-11-24 ENCOUNTER — Other Ambulatory Visit: Payer: Self-pay

## 2018-11-24 ENCOUNTER — Emergency Department (HOSPITAL_BASED_OUTPATIENT_CLINIC_OR_DEPARTMENT_OTHER)
Admission: EM | Admit: 2018-11-24 | Discharge: 2018-11-24 | Disposition: A | Discharge status: Home / Self Care | Payer: Medicare HMO | Attending: Emergency Medicine | Admitting: Emergency Medicine

## 2018-11-24 ENCOUNTER — Encounter (HOSPITAL_BASED_OUTPATIENT_CLINIC_OR_DEPARTMENT_OTHER): Payer: Self-pay | Admitting: Emergency Medicine

## 2018-11-24 DIAGNOSIS — R531 Weakness: Secondary | ICD-10-CM | POA: Diagnosis not present

## 2018-11-24 DIAGNOSIS — Z79899 Other long term (current) drug therapy: Secondary | ICD-10-CM | POA: Insufficient documentation

## 2018-11-24 DIAGNOSIS — N183 Chronic kidney disease, stage 3 unspecified: Secondary | ICD-10-CM

## 2018-11-24 DIAGNOSIS — E1122 Type 2 diabetes mellitus with diabetic chronic kidney disease: Secondary | ICD-10-CM | POA: Diagnosis not present

## 2018-11-24 DIAGNOSIS — Z87891 Personal history of nicotine dependence: Secondary | ICD-10-CM | POA: Insufficient documentation

## 2018-11-24 DIAGNOSIS — E876 Hypokalemia: Secondary | ICD-10-CM | POA: Insufficient documentation

## 2018-11-24 DIAGNOSIS — Z20828 Contact with and (suspected) exposure to other viral communicable diseases: Secondary | ICD-10-CM | POA: Insufficient documentation

## 2018-11-24 DIAGNOSIS — Z7982 Long term (current) use of aspirin: Secondary | ICD-10-CM | POA: Diagnosis not present

## 2018-11-24 DIAGNOSIS — Z950 Presence of cardiac pacemaker: Secondary | ICD-10-CM | POA: Insufficient documentation

## 2018-11-24 DIAGNOSIS — I129 Hypertensive chronic kidney disease with stage 1 through stage 4 chronic kidney disease, or unspecified chronic kidney disease: Secondary | ICD-10-CM | POA: Diagnosis not present

## 2018-11-24 HISTORY — DX: Paroxysmal atrial fibrillation: I48.0

## 2018-11-24 LAB — CBC WITH DIFFERENTIAL/PLATELET
Abs Immature Granulocytes: 0.01 10*3/uL (ref 0.00–0.07)
Basophils Absolute: 0 10*3/uL (ref 0.0–0.1)
Basophils Relative: 1 %
Eosinophils Absolute: 0.1 10*3/uL (ref 0.0–0.5)
Eosinophils Relative: 2 %
HCT: 39.3 % (ref 36.0–46.0)
Hemoglobin: 12.8 g/dL (ref 12.0–15.0)
Immature Granulocytes: 0 %
Lymphocytes Relative: 49 %
Lymphs Abs: 2.7 10*3/uL (ref 0.7–4.0)
MCH: 28.2 pg (ref 26.0–34.0)
MCHC: 32.6 g/dL (ref 30.0–36.0)
MCV: 86.6 fL (ref 80.0–100.0)
Monocytes Absolute: 0.5 10*3/uL (ref 0.1–1.0)
Monocytes Relative: 10 %
Neutro Abs: 2.1 10*3/uL (ref 1.7–7.7)
Neutrophils Relative %: 38 %
Platelets: 303 10*3/uL (ref 150–400)
RBC: 4.54 MIL/uL (ref 3.87–5.11)
RDW: 14.6 % (ref 11.5–15.5)
WBC: 5.5 10*3/uL (ref 4.0–10.5)
nRBC: 0 % (ref 0.0–0.2)

## 2018-11-24 LAB — COMPREHENSIVE METABOLIC PANEL
ALT: 20 U/L (ref 0–44)
AST: 25 U/L (ref 15–41)
Albumin: 4.1 g/dL (ref 3.5–5.0)
Alkaline Phosphatase: 69 U/L (ref 38–126)
Anion gap: 13 (ref 5–15)
BUN: 25 mg/dL — ABNORMAL HIGH (ref 8–23)
CO2: 25 mmol/L (ref 22–32)
Calcium: 9.7 mg/dL (ref 8.9–10.3)
Chloride: 97 mmol/L — ABNORMAL LOW (ref 98–111)
Creatinine, Ser: 2.69 mg/dL — ABNORMAL HIGH (ref 0.44–1.00)
GFR calc Af Amer: 19 mL/min — ABNORMAL LOW (ref >60)
GFR calc non Af Amer: 16 mL/min — ABNORMAL LOW (ref >60)
Glucose, Bld: 105 mg/dL — ABNORMAL HIGH (ref 70–99)
Potassium: 2.9 mmol/L — ABNORMAL LOW (ref 3.5–5.1)
Sodium: 135 mmol/L (ref 135–145)
Total Bilirubin: 0.9 mg/dL (ref 0.3–1.2)
Total Protein: 7.4 g/dL (ref 6.5–8.1)

## 2018-11-24 LAB — URINALYSIS, ROUTINE W REFLEX MICROSCOPIC
Bilirubin Urine: NEGATIVE
Glucose, UA: NEGATIVE mg/dL
Hgb urine dipstick: NEGATIVE
Ketones, ur: NEGATIVE mg/dL
Leukocytes,Ua: NEGATIVE
Nitrite: NEGATIVE
Protein, ur: NEGATIVE mg/dL
Specific Gravity, Urine: <1.005 — ABNORMAL LOW (ref 1.005–1.030)
pH: 6 (ref 5.0–8.0)

## 2018-11-24 LAB — SARS CORONAVIRUS 2 BY RT PCR (HOSPITAL ORDER, PERFORMED IN ~~LOC~~ HOSPITAL LAB): SARS Coronavirus 2: NEGATIVE

## 2018-11-24 MED ORDER — POTASSIUM CHLORIDE CRYS ER 20 MEQ PO TBCR
40.0000 meq | EXTENDED_RELEASE_TABLET | Freq: Once | ORAL | Status: AC
  Administered 2018-11-24: 40 meq via ORAL
  Filled 2018-11-24: qty 2

## 2018-11-24 MED ORDER — POTASSIUM CHLORIDE CRYS ER 20 MEQ PO TBCR: 20.0000 meq | EXTENDED_RELEASE_TABLET | Freq: Every day | ORAL | 0 refills | Status: AC

## 2018-11-24 NOTE — ED Notes (Signed)
Pt to CT at this time.

## 2018-11-24 NOTE — ED Provider Notes (Signed)
[facility] Provider Note: [name], MD, [name]  CSN: [id number] MRN: [id number] ARRIVAL: 11/24/18 at 0116 ROOM: [facility]06/[facility]06   CHIEF COMPLAINT  Weakness   HISTORY OF PRESENT ILLNESS  11/24/18 4:29 AM Summer Harmon is a 78 y.o. female who was seen on the eighth of this month for orthostatic hypotension.  She was treated and discharged home.  She declined admission at that time.  She returns complaining of generalized weakness which is severe enough that she has difficulty standing at times.  The weakness comes and goes.  She attributes her weakness to being on clonidine which was reportedly discontinued on the last visit but may have been restarted by her PCP.  She states she fell "last Wednesday" (today is Wednesday) but on further questioning states that it was probably a day or 2 ago.  She struck her right parietal scalp and has a hematoma there.  She states she saw her primary care physician yesterday who advised her to come to the ED if she got weak again and to have x-rays of her head.   Review of her records indicates known chronic kidney disease, stage III, for which [facility]  [facility].  She has a diagnosis of paroxysmal atrial fibrillation and has a pacemaker.  She contacted her PCPs office yesterday and reported that she was told to be checked for intracranial breathing.  She was further advised to present to the ED for evaluation of this.   Past Medical History:  Diagnosis Date   Diabetes mellitus without complication (HCC)    Hypertension    Paroxysmal atrial fibrillation (HCC)    Renal disorder     Past Surgical History:  Procedure Laterality Date   CHOLECYSTECTOMY     PACEMAKER INSERTION      No family history on file.  Social History   Tobacco Use   Smoking status: Former Smoker   Smokeless tobacco: Never Used  Substance Use Topics     Alcohol use: No   Drug use: Never    Prior to Admission medications   Medication Sig Start Date End Date Taking? Authorizing Provider  aspirin EC 81 MG tablet Take by mouth. 02/28/12  Yes [provider]  diltiazem (CARDIZEM CD) 360 MG 24 hr capsule Take by mouth. 11/18/18 11/18/19 Yes [provider]  losartan (COZAAR) 50 MG tablet Take by mouth. 11/18/18 11/18/19 Yes [provider]  cloNIDine (CATAPRES) 0.2 MG tablet Take 0.2 mg by mouth 3 (three) times daily.    [provider]  lovastatin (MEVACOR) 10 MG tablet Take 40 mg by mouth daily.    [provider]    Allergies Penicillins and Sulfa antibiotics   REVIEW OF SYSTEMS  Negative except as noted here or in the History of Present Illness.   PHYSICAL EXAMINATION  Initial Vital Signs Blood pressure (!) 142/87, pulse 80, temperature 98.7 F (37.1 C), temperature source Oral, resp. rate 17, SpO2 97 %.  Examination General: Well-developed, well-nourished female in no acute distress; appearance consistent with age of record HENT: normocephalic; small right parietal scalp hematoma Eyes: pupils equal, round and reactive to light; extraocular muscles intact; arcus senilis bilaterally Neck: supple Heart: regular rate and rhythm Lungs: clear to auscultation bilaterally Abdomen: soft; nondistended; nontender; bowel sounds present Extremities: No deformity; full range of motion; pulses normal Neurologic: Awake, alert and oriented; motor function intact in all extremities  and symmetric; no facial droop Skin: Warm and dry Psychiatric: Normal mood and affect   RESULTS  Summary of this visit's results, reviewed by myself:   EKG Interpretation  Date/Time:  Wednesday November 24 2018 01:38:02 EDT Ventricular Rate:  74 PR Interval:    QRS Duration: 108 QT Interval:  449 QTC Calculation: 499 R Axis:     Text Interpretation:  Atrial-paced rhythm Left ventricular hypertrophy Nonspecific T  abnormalities, anterior leads Borderline prolonged QT interval Confirmed by Shanon Rosser [phone]) on 11/24/2018 1:55:32 AM      Laboratory Studies: Results for orders placed or performed during the hospital encounter of 11/24/18 (from the past 24 hour(s))  CBC with Differential     Status: None   Collection Time: 11/24/18  1:40 AM  Result Value Ref Range   WBC 5.5 4.0 - 10.5 K/uL   RBC 4.54 3.87 - 5.11 MIL/uL   Hemoglobin 12.8 12.0 - 15.0 g/dL   HCT 39.3 36.0 - 46.0 %   MCV 86.6 80.0 - 100.0 fL   MCH 28.2 26.0 - 34.0 pg   MCHC 32.6 30.0 - 36.0 g/dL   RDW 14.6 11.5 - 15.5 %   Platelets 303 150 - 400 K/uL   nRBC 0.0 0.0 - 0.2 %   Neutrophils Relative % 38 %   Neutro Abs 2.1 1.7 - 7.7 K/uL   Lymphocytes Relative 49 %   Lymphs Abs 2.7 0.7 - 4.0 K/uL   Monocytes Relative 10 %   Monocytes Absolute 0.5 0.1 - 1.0 K/uL   Eosinophils Relative 2 %   Eosinophils Absolute 0.1 0.0 - 0.5 K/uL   Basophils Relative 1 %   Basophils Absolute 0.0 0.0 - 0.1 K/uL   Immature Granulocytes 0 %   Abs Immature Granulocytes 0.01 0.00 - 0.07 K/uL  Comprehensive metabolic panel     Status: Abnormal   Collection Time: 11/24/18  1:40 AM  Result Value Ref Range   Sodium 135 135 - 145 mmol/L   Potassium 2.9 (L) 3.5 - 5.1 mmol/L   Chloride 97 (L) 98 - 111 mmol/L   CO2 25 22 - 32 mmol/L   Glucose, Bld 105 (H) 70 - 99 mg/dL   BUN 25 (H) 8 - 23 mg/dL   Creatinine, Ser 2.69 (H) 0.44 - 1.00 mg/dL   Calcium 9.7 8.9 - 10.3 mg/dL   Total Protein 7.4 6.5 - 8.1 g/dL   Albumin 4.1 3.5 - 5.0 g/dL   AST 25 15 - 41 U/L   ALT 20 0 - 44 U/L   Alkaline Phosphatase 69 38 - 126 U/L   Total Bilirubin 0.9 0.3 - 1.2 mg/dL   GFR calc non Af Amer 16 (L) >60 mL/min   GFR calc Af Amer 19 (L) >60 mL/min   Anion gap 13 5 - 15  Urinalysis, Routine w reflex microscopic     Status: Abnormal   Collection Time: 11/24/18  3:23 AM  Result Value Ref Range   Color, Urine YELLOW YELLOW   APPearance CLEAR CLEAR   Specific Gravity,  Urine <1.005 (L) 1.005 - 1.030   pH 6.0 5.0 - 8.0   Glucose, UA NEGATIVE NEGATIVE mg/dL   Hgb urine dipstick NEGATIVE NEGATIVE   Bilirubin Urine NEGATIVE NEGATIVE   Ketones, ur NEGATIVE NEGATIVE mg/dL   Protein, ur NEGATIVE NEGATIVE mg/dL   Nitrite NEGATIVE NEGATIVE   Leukocytes,Ua NEGATIVE NEGATIVE   Imaging Studies: Ct Head Wo Contrast  Result Date: 11/24/2018 CLINICAL DATA:  Generalized weakness.  Recent fall EXAM: CT HEAD WITHOUT CONTRAST TECHNIQUE: Contiguous axial images were obtained from the base of the skull through the vertex without intravenous contrast. COMPARISON:  None. FINDINGS: Brain: No evidence of acute infarction, hemorrhage, hydrocephalus, extra-axial collection or mass lesion/mass effect. Generalized atrophy in keeping with age. Vascular: Atherosclerotic calcification Skull: Chronic metallic fragments in the left parietal scalp and outer table calvarium. Sinuses/Orbits: Negative IMPRESSION: Senescent changes without acute or focal finding. Electronically Signed   By: Marnee SpringJonathon  Watts M.D.   On: 11/24/2018 05:12    ED COURSE and MDM  Nursing notes and initial vitals signs, including pulse oximetry, reviewed.  Vitals:   11/24/18 0330 11/24/18 0345 11/24/18 0400 11/24/18 0415  BP: 94/70 (!) 144/91 127/87 (!) 142/87  Pulse: 81 79 79 80  Resp: 17 15 15 17   Temp:      TempSrc:      SpO2: 99% 100% 99% 97%   5:31 AM The patient's head CT shows no evidence of intracranial injury.  As noted above her kidney function has worsened but she did see her nephrologist yesterday.  She has an appointment with her PCP at 1030 this morning and she was encouraged to follow-up.  She was advised to discuss her worsening kidney function with her PCP who can coordinate with her nephrologist.  PROCEDURES    ED DIAGNOSES     ICD-10-CM   1. Generalized weakness  R53.1   2. Stage 3 chronic kidney disease (HCC)  N18.3   3. Hypokalemia  E87.6        Summer Harmon, Jonny RuizJohn, MD 11/24/18 [phone]

## 2018-11-24 NOTE — ED Triage Notes (Signed)
C/O weakness. Denies pain. Syncope or falls. PT here for same last week and left ama.

## 2018-11-24 NOTE — ED Notes (Signed)
ED Provider at bedside. 

## 2020-05-23 ENCOUNTER — Encounter (HOSPITAL_BASED_OUTPATIENT_CLINIC_OR_DEPARTMENT_OTHER): Payer: Self-pay | Admitting: Emergency Medicine

## 2020-05-23 ENCOUNTER — Emergency Department (HOSPITAL_BASED_OUTPATIENT_CLINIC_OR_DEPARTMENT_OTHER)
Admission: EM | Admit: 2020-05-23 | Discharge: 2020-05-23 | Disposition: A | Discharge status: Home / Self Care | Payer: Medicare (Managed Care) | Attending: Emergency Medicine | Admitting: Emergency Medicine

## 2020-05-23 ENCOUNTER — Other Ambulatory Visit: Payer: Self-pay

## 2020-05-23 DIAGNOSIS — K5900 Constipation, unspecified: Secondary | ICD-10-CM | POA: Insufficient documentation

## 2020-05-23 DIAGNOSIS — Z5321 Procedure and treatment not carried out due to patient leaving prior to being seen by health care provider: Secondary | ICD-10-CM | POA: Insufficient documentation

## 2020-05-23 DIAGNOSIS — R110 Nausea: Secondary | ICD-10-CM | POA: Diagnosis present

## 2020-05-23 LAB — CBC
HCT: 37.5 % (ref 36.0–46.0)
Hemoglobin: 12.3 g/dL (ref 12.0–15.0)
MCH: 28.6 pg (ref 26.0–34.0)
MCHC: 32.8 g/dL (ref 30.0–36.0)
MCV: 87.2 fL (ref 80.0–100.0)
Platelets: 250 10*3/uL (ref 150–400)
RBC: 4.3 MIL/uL (ref 3.87–5.11)
RDW: 15.2 % (ref 11.5–15.5)
WBC: 4.7 10*3/uL (ref 4.0–10.5)
nRBC: 0 % (ref 0.0–0.2)

## 2020-05-23 LAB — COMPREHENSIVE METABOLIC PANEL
ALT: 12 U/L (ref 0–44)
AST: 21 U/L (ref 15–41)
Albumin: 3.6 g/dL (ref 3.5–5.0)
Alkaline Phosphatase: 66 U/L (ref 38–126)
Anion gap: 10 (ref 5–15)
BUN: 26 mg/dL — ABNORMAL HIGH (ref 8–23)
CO2: 25 mmol/L (ref 22–32)
Calcium: 9.5 mg/dL (ref 8.9–10.3)
Chloride: 100 mmol/L (ref 98–111)
Creatinine, Ser: 1.27 mg/dL — ABNORMAL HIGH (ref 0.44–1.00)
GFR, Estimated: 42 mL/min — ABNORMAL LOW (ref >60)
Glucose, Bld: 120 mg/dL — ABNORMAL HIGH (ref 70–99)
Potassium: 3.7 mmol/L (ref 3.5–5.1)
Sodium: 135 mmol/L (ref 135–145)
Total Bilirubin: 0.5 mg/dL (ref 0.3–1.2)
Total Protein: 7.2 g/dL (ref 6.5–8.1)

## 2020-05-23 LAB — LIPASE, BLOOD: Lipase: 22 U/L (ref 11–51)

## 2020-05-23 MED ORDER — ONDANSETRON 4 MG PO TBDP
4.0000 mg | ORAL_TABLET | Freq: Once | ORAL | Status: AC | PRN
  Administered 2020-05-23: 4 mg via ORAL
  Filled 2020-05-23: qty 1

## 2020-05-23 NOTE — ED Triage Notes (Addendum)
Reports feeling constipated today and nauseated.  Assisted patient to bathroom during triage.  Was able to have a BM.

## 2020-05-26 ENCOUNTER — Emergency Department (HOSPITAL_BASED_OUTPATIENT_CLINIC_OR_DEPARTMENT_OTHER)
Admission: EM | Admit: 2020-05-26 | Discharge: 2020-05-26 | Disposition: A | Discharge status: Home / Self Care | Payer: Medicare (Managed Care) | Attending: Emergency Medicine | Admitting: Emergency Medicine

## 2020-05-26 ENCOUNTER — Emergency Department (HOSPITAL_BASED_OUTPATIENT_CLINIC_OR_DEPARTMENT_OTHER): Payer: Medicare (Managed Care)

## 2020-05-26 ENCOUNTER — Other Ambulatory Visit: Payer: Self-pay

## 2020-05-26 ENCOUNTER — Encounter (HOSPITAL_BASED_OUTPATIENT_CLINIC_OR_DEPARTMENT_OTHER): Payer: Self-pay | Admitting: Emergency Medicine

## 2020-05-26 DIAGNOSIS — Z87891 Personal history of nicotine dependence: Secondary | ICD-10-CM | POA: Diagnosis not present

## 2020-05-26 DIAGNOSIS — H53149 Visual discomfort, unspecified: Secondary | ICD-10-CM | POA: Insufficient documentation

## 2020-05-26 DIAGNOSIS — E119 Type 2 diabetes mellitus without complications: Secondary | ICD-10-CM | POA: Insufficient documentation

## 2020-05-26 DIAGNOSIS — Z79899 Other long term (current) drug therapy: Secondary | ICD-10-CM | POA: Insufficient documentation

## 2020-05-26 DIAGNOSIS — H4052X0 Glaucoma secondary to other eye disorders, left eye, stage unspecified: Secondary | ICD-10-CM | POA: Diagnosis not present

## 2020-05-26 DIAGNOSIS — Z7982 Long term (current) use of aspirin: Secondary | ICD-10-CM | POA: Insufficient documentation

## 2020-05-26 DIAGNOSIS — I10 Essential (primary) hypertension: Secondary | ICD-10-CM | POA: Diagnosis not present

## 2020-05-26 DIAGNOSIS — R519 Headache, unspecified: Secondary | ICD-10-CM | POA: Diagnosis present

## 2020-05-26 DIAGNOSIS — Z950 Presence of cardiac pacemaker: Secondary | ICD-10-CM | POA: Diagnosis not present

## 2020-05-26 LAB — CBC WITH DIFFERENTIAL/PLATELET
Abs Immature Granulocytes: 0.01 10*3/uL (ref 0.00–0.07)
Basophils Absolute: 0 10*3/uL (ref 0.0–0.1)
Basophils Relative: 0 %
Eosinophils Absolute: 0 10*3/uL (ref 0.0–0.5)
Eosinophils Relative: 1 %
HCT: 37.7 % (ref 36.0–46.0)
Hemoglobin: 12.7 g/dL (ref 12.0–15.0)
Immature Granulocytes: 0 %
Lymphocytes Relative: 29 %
Lymphs Abs: 1.4 10*3/uL (ref 0.7–4.0)
MCH: 29 pg (ref 26.0–34.0)
MCHC: 33.7 g/dL (ref 30.0–36.0)
MCV: 86.1 fL (ref 80.0–100.0)
Monocytes Absolute: 0.6 10*3/uL (ref 0.1–1.0)
Monocytes Relative: 13 %
Neutro Abs: 2.7 10*3/uL (ref 1.7–7.7)
Neutrophils Relative %: 57 %
Platelets: 258 10*3/uL (ref 150–400)
RBC: 4.38 MIL/uL (ref 3.87–5.11)
RDW: 14.7 % (ref 11.5–15.5)
WBC: 4.8 10*3/uL (ref 4.0–10.5)
nRBC: 0 % (ref 0.0–0.2)

## 2020-05-26 LAB — BASIC METABOLIC PANEL
Anion gap: 9 (ref 5–15)
BUN: 9 mg/dL (ref 8–23)
CO2: 27 mmol/L (ref 22–32)
Calcium: 9.4 mg/dL (ref 8.9–10.3)
Chloride: 102 mmol/L (ref 98–111)
Creatinine, Ser: 1.39 mg/dL — ABNORMAL HIGH (ref 0.44–1.00)
GFR, Estimated: 38 mL/min — ABNORMAL LOW (ref >60)
Glucose, Bld: 107 mg/dL — ABNORMAL HIGH (ref 70–99)
Potassium: 3.3 mmol/L — ABNORMAL LOW (ref 3.5–5.1)
Sodium: 138 mmol/L (ref 135–145)

## 2020-05-26 MED ORDER — KETOROLAC TROMETHAMINE 15 MG/ML IJ SOLN: 7.5000 mg | Freq: Once | INTRAMUSCULAR | Status: DC

## 2020-05-26 MED ORDER — BRIMONIDINE TARTRATE 0.2 % OP SOLN: 1.0000 [drp] | Freq: Three times a day (TID) | OPHTHALMIC | 0 refills | Status: AC

## 2020-05-26 MED ORDER — METOCLOPRAMIDE HCL 5 MG/ML IJ SOLN
5.0000 mg | Freq: Once | INTRAMUSCULAR | Status: AC
  Administered 2020-05-26: 5 mg via INTRAVENOUS
  Filled 2020-05-26: qty 2

## 2020-05-26 MED ORDER — KETOROLAC TROMETHAMINE 15 MG/ML IJ SOLN
15.0000 mg | Freq: Once | INTRAMUSCULAR | Status: AC
  Administered 2020-05-26: 15 mg via INTRAVENOUS
  Filled 2020-05-26: qty 1

## 2020-05-26 MED ORDER — TETRACAINE HCL 0.5 % OP SOLN
OPHTHALMIC | Status: AC
  Administered 2020-05-26: 2 [drp] via OPHTHALMIC
  Filled 2020-05-26: qty 4

## 2020-05-26 MED ORDER — DIPHENHYDRAMINE HCL 50 MG/ML IJ SOLN
12.5000 mg | Freq: Once | INTRAMUSCULAR | Status: AC
  Administered 2020-05-26: 12.5 mg via INTRAVENOUS
  Filled 2020-05-26: qty 1

## 2020-05-26 MED ORDER — ACETAMINOPHEN 325 MG PO TABS
650.0000 mg | ORAL_TABLET | Freq: Once | ORAL | Status: AC
  Administered 2020-05-26: 650 mg via ORAL
  Filled 2020-05-26: qty 2

## 2020-05-26 MED ORDER — DEXAMETHASONE SODIUM PHOSPHATE 4 MG/ML IJ SOLN
4.0000 mg | Freq: Once | INTRAMUSCULAR | Status: AC
  Administered 2020-05-26: 4 mg via INTRAVENOUS
  Filled 2020-05-26: qty 1

## 2020-05-26 MED ORDER — SODIUM CHLORIDE 0.9 % IV BOLUS
500.0000 mL | Freq: Once | INTRAVENOUS | Status: AC
  Administered 2020-05-26: 500 mL via INTRAVENOUS

## 2020-05-26 MED ORDER — METOCLOPRAMIDE HCL 5 MG/ML IJ SOLN
10.0000 mg | Freq: Once | INTRAMUSCULAR | Status: AC
  Administered 2020-05-26: 5 mg via INTRAVENOUS
  Filled 2020-05-26: qty 2

## 2020-05-26 MED ORDER — HALOPERIDOL LACTATE 5 MG/ML IJ SOLN
2.0000 mg | Freq: Once | INTRAMUSCULAR | Status: AC
  Administered 2020-05-26: 2 mg via INTRAVENOUS
  Filled 2020-05-26: qty 1

## 2020-05-26 MED ORDER — TETRACAINE HCL 0.5 % OP SOLN: 2.0000 [drp] | Freq: Once | OPHTHALMIC | Status: AC

## 2020-05-26 NOTE — ED Triage Notes (Signed)
Pt o headache x 2 months with light sensitivity.

## 2020-05-26 NOTE — ED Notes (Signed)
EDP at bedside, doing exam.  Visual acuity being completed as well.

## 2020-05-26 NOTE — ED Provider Notes (Addendum)
Physical Exam  BP (!) 172/98 (BP Location: Left Arm)   Pulse 76   Temp 98.5 F (36.9 C) (Oral)   Resp 17   Ht 5\' 4"  (1.626 m)   Wt 82 kg   SpO2 99%   BMI 31.03 kg/m   Physical Exam  ED Course/Procedures     .Critical Care Performed by: , MD Authorized by: Derwood Kaplan, MD   Critical care provider statement:    Critical care time (minutes):  48   Critical care was necessary to treat or prevent imminent or life-threatening deterioration of the following conditions:  Circulatory failure (Elevated eye pressures)   Critical care was time spent personally by me on the following activities:  Discussions with consultants, evaluation of patient's response to treatment, examination of patient, ordering and performing treatments and interventions, ordering and review of laboratory studies, ordering and review of radiographic studies, pulse oximetry, re-evaluation of patient's condition, obtaining history from patient or surrogate and review of old charts    MDM    Assuming care of patient from Dr. Derwood Kaplan.   Patient in the ED for headache x 2 months. No neuro deficits. Workup thus far shows - normal CT.  Concerning findings are as following : none Important pending results are labs.  According to Dr. Read Drivers, plan is to give headache cocktail and discharge with pcp/neuro f/u given her age.  Patient had no complains, no concerns from the nursing side. Will continue to monitor.    Read Drivers, MD 05/26/20 [phone]  8:30am reassessment: Patient still having pain in her head.  Briefly, headache has been present for 2 months and is intense, not responding to Tylenol.  Pain has waxing and waning intensity.  No associated neurologic symptoms.  Patient did see an ophthalmologist on Monday and had some fluid drained from her left eye.  She states that she has photophobia, and that her symptoms are worse around her left eye compared to right.  Sister confirms that she is  getting all the eyedrops that were prescribed.  I numbed patient up and checked her eye pressures. On exam, her pupils are 3 mm and equal, mild chemosis over the left eye, cornea is hazy bilaterally, EOMI, no consensual photophobia.  Patient's eye pressures on the right side were 31, 27.  Eye pressure on the left side were 71 and 51.  I called Tuesday retina specialist and spoke with Dr. [place].  We went over patient's presenting symptoms, recent ophthalmic visit and the eye pressures. Dr. Peggye Pitt informed me that during the clinic visit on Monday, her eye pressures were in the 50s.  She was started on drops that might require to be taken more frequently.  He also wants Tuesday to add brimonidine 0.2% 3 times daily and Diamox 500 mg oral twice daily.  If the patient's symptoms are not getting better, then she needs to call the clinic and leave a message for the emergency line.  Patient is allergic to sulfa, and does not want to take Diamox.  It does not appear that she has known history of severe allergy to sulfa.  We discussed the risk versus benefit of taking Diamox, and for now patient wants to simply take the extra eyedrop and take the Diamox only if the headache is not improving.  She will take all of her eyedrops once at home.  Further headache medications ordered. It is unclear to me if the underlying headache is all related to her eye pressures or is a  separate organic headache.  Patient's headache has been present for 2 months, but her first visit with ophthalmologist was last Monday and they noted elevated pressure.  It is certainly possible that she has been walking around with elevated eye pressures for the last several weeks and was finally diagnosed with that on her visit on Monday.  Ophthalmologist informs me that her Thursday's appointment was canceled, which does not help in this acute situation to identify the root cause for the headache.  We will advise close ophthalmology follow-up  but also provide neurology phone number so that if patient's headache is not improving despite maximal intervention by ophthalmology then she can see them.  10:24 AM Patient is headache free and resting comfortably. She will follow-up with ophthalmology as planned and call them sooner if her symptoms get worse.  She will pick up the eyedrops on her way home.  Advised that she take Tylenol every 6 hours pain or no pain for the next week. Advise calling ophthalmologist for a quick follow-up on the eye pressures, and to consult neurology if not getting better despite intervention from ophthalmology.  Strict ER return precautions have been discussed, and patient is agreeing with the plan and is comfortable with the workup done and the recommendations from the ER.    Derwood Kaplan, MD 05/26/20 1025

## 2020-05-26 NOTE — Discharge Instructions (Addendum)
We saw in the ER for headaches.  As discussed, the high pressures on the left side were elevated.  We did contact Dr. Peggye Pitt at [facility] retina specialist.  He has recommended that you take the already prescribed eyedrops as following:  COSPOT --- 3 times a day Atrophy --- 3 times a day Prednisone acetate --- 4 times a day  In addition, we are adding another eyedrop prescription.  Please pick it up on your way home.  Call the eye eye doctor if your pain continues to be intense.  They have an emergency answering service and will call you back.  You should see them on Monday, especially since the appointment on Thursday was canceled.  At this time we suspect that your pain is because of eye pressures.  However, if the eye doctors have thoroughly assessed you and the pain continues to be an issue, please see a neurologist by calling the number provided.  Return to the ER if you start having vision loss, any neurologic symptoms such as one-sided numbness, weakness, balance issues, confusion.

## 2020-05-26 NOTE — ED Notes (Signed)
Pain not improved with toradol, reglan and decadron. Pt requesting to give more medication. Pt informed that haldol will make her tired, states she understands.

## 2020-05-26 NOTE — ED Notes (Signed)
Acuity with glasses on, pt was not able to see out of left eye.

## 2020-05-26 NOTE — ED Provider Notes (Signed)
[facility] Provider Note: Lowella Dell, MD, [name]  CSN: [id number] MRN: [id number] ARRIVAL: 05/26/20 at 0421 ROOM: [facility]03/[facility]03   CHIEF COMPLAINT  Headache   HISTORY OF PRESENT ILLNESS  05/26/20 6:17 AM Summer Harmon is a 80 y.o. female with a history of hypertension and glaucoma.  She denies being on any glaucoma medications but her sister corrected her and states that she (the sister) puts 3 different drops in her eyes every day.  She also is on antihypertensives which her sister seems that she takes every day.  The patient is here with a headache.  She states she has had it for 1-1/2 weeks but the sister corrects her and states that been for 2 months.  She describes the headache is involving her entire head including the frontal, temporal, parietal and occipital regions.  She rates the pain as a 10 out of 10 and pounding in nature.  She has not gotten relief with over-the-counter analgesics.  She has associated photophobia and nausea but no vomiting.  She is not aware of any focal numbness or weakness.  Nursing staff relates she is able to ambulate without difficulty.   Past Medical History:  Diagnosis Date  . Diabetes mellitus without complication (HCC)   . Hypertension   . Paroxysmal atrial fibrillation (HCC)   . Renal disorder     Past Surgical History:  Procedure Laterality Date  . CHOLECYSTECTOMY    . PACEMAKER INSERTION      No family history on file.  Social History   Tobacco Use  . Smoking status: Former Games developer  . Smokeless tobacco: Never Used  Vaping Use  . Vaping Use: Never used  Substance Use Topics  . Alcohol use: No  . Drug use: Never    Prior to Admission medications   Medication Sig Start Date End Date Taking? Authorizing Provider  acetaminophen (TYLENOL) 325 MG tablet Take 650 mg by mouth as needed for headache.   Yes [provider]  brimonidine (ALPHAGAN) 0.2 % ophthalmic solution Place 1 drop into the left eye 3 (three)  times daily. 05/26/20  Yes Derwood Kaplan, MD  aspirin EC 81 MG tablet Take by mouth. 02/28/12   [provider]  cloNIDine (CATAPRES) 0.2 MG tablet Take 0.2 mg by mouth 3 (three) times daily.    [provider]  diltiazem (CARDIZEM CD) 360 MG 24 hr capsule Take by mouth. 11/18/18 11/18/19  [provider]  losartan (COZAAR) 50 MG tablet Take by mouth. 11/18/18 11/18/19  [provider]  lovastatin (MEVACOR) 10 MG tablet Take 40 mg by mouth daily.    [provider]  potassium chloride SA (K-DUR) 20 MEQ tablet Take 1 tablet (20 mEq total) by mouth daily. 11/24/18   Lalana Wachter, MD    Allergies Penicillins and Sulfa antibiotics   REVIEW OF SYSTEMS  Negative except as noted here or in the History of Present Illness.   PHYSICAL EXAMINATION  Initial Vital Signs Blood pressure (!) 174/105, pulse 81, temperature 98.4 F (36.9 C), temperature source Oral, resp. rate 16, height 5\' 4"  (1.626 m), weight 82 kg, SpO2 99 %.  Examination General: Well-developed, well-nourished female in no acute distress; appearance consistent with age of record HENT: normocephalic; atraumatic Eyes: Pupils dilated; extraocular muscles intact; arcus senilis and generalized corneal opacity bilaterally; unable to visualize fundi, possibly due to corneal opacity Neck: supple Heart: regular rate and rhythm Lungs: clear to auscultation bilaterally Abdomen: soft; nondistended; nontender; bowel sounds present Extremities: No  deformity; full range of motion; pulses normal Neurologic: Awake, alert, confused; motor function intact in all extremities and symmetric; no facial droop Skin: Warm and dry Psychiatric: Normal mood and affect   RESULTS  Summary of this visit's results, reviewed and interpreted by myself:   EKG Interpretation  Date/Time:    Ventricular Rate:    PR Interval:    QRS Duration:   QT Interval:    QTC Calculation:   R Axis:     Text Interpretation:         Laboratory Studies: Results for orders placed or performed during the hospital encounter of 05/26/20 (from the past 24 hour(s))  CBC with Differential/Platelet     Status: None   Collection Time: 05/26/20  7:29 AM  Result Value Ref Range   WBC 4.8 4.0 - 10.5 K/uL   RBC 4.38 3.87 - 5.11 MIL/uL   Hemoglobin 12.7 12.0 - 15.0 g/dL   HCT 09.2 33.0 - 07.6 %   MCV 86.1 80.0 - 100.0 fL   MCH 29.0 26.0 - 34.0 pg   MCHC 33.7 30.0 - 36.0 g/dL   RDW 22.6 33.3 - 54.5 %   Platelets 258 150 - 400 K/uL   nRBC 0.0 0.0 - 0.2 %   Neutrophils Relative % 57 %   Neutro Abs 2.7 1.7 - 7.7 K/uL   Lymphocytes Relative 29 %   Lymphs Abs 1.4 0.7 - 4.0 K/uL   Monocytes Relative 13 %   Monocytes Absolute 0.6 0.1 - 1.0 K/uL   Eosinophils Relative 1 %   Eosinophils Absolute 0.0 0.0 - 0.5 K/uL   Basophils Relative 0 %   Basophils Absolute 0.0 0.0 - 0.1 K/uL   Immature Granulocytes 0 %   Abs Immature Granulocytes 0.01 0.00 - 0.07 K/uL  Basic metabolic panel     Status: Abnormal   Collection Time: 05/26/20  7:29 AM  Result Value Ref Range   Sodium 138 135 - 145 mmol/L   Potassium 3.3 (L) 3.5 - 5.1 mmol/L   Chloride 102 98 - 111 mmol/L   CO2 27 22 - 32 mmol/L   Glucose, Bld 107 (H) 70 - 99 mg/dL   BUN 9 8 - 23 mg/dL   Creatinine, Ser 6.25 (H) 0.44 - 1.00 mg/dL   Calcium 9.4 8.9 - 63.8 mg/dL   GFR, Estimated 38 (L) >60 mL/min   Anion gap 9 5 - 15   Imaging Studies: CT Head Wo Contrast  Result Date: 05/26/2020 CLINICAL DATA:  80 year old female with persistent headache for 2 months. Light sensitivity. EXAM: CT HEAD WITHOUT CONTRAST TECHNIQUE: Contiguous axial images were obtained from the base of the skull through the vertex without intravenous contrast. COMPARISON:  Head CT 11/24/2018, [facility] 09/24/2016 FINDINGS: Brain: Cerebral volume is stable since 2018. Chronic dystrophic calcifications along the dura including the bilateral tentorium. Streak artifact  again noted from chronic retained left posterior scalp metallic foreign bodies. No midline shift, ventriculomegaly, mass effect, evidence of mass lesion, intracranial hemorrhage or evidence of cortically based acute infarction. Stable gray-white matter differentiation since 2018 with scattered white matter hypodensity. Vascular: Calcified atherosclerosis at the skull base. No suspicious intracranial vascular hyperdensity. Skull: Mild motion artifact at the skull base. No acute osseous abnormality identified. Sinuses/Orbits: Visualized paranasal sinuses and mastoids are stable and well pneumatized. Other: Motion artifact at the orbits. Chronic retained metallic foreign bodies along the left scalp parietal convexity. Underlying calvarium intact as before. No acute soft  tissue finding identified. IMPRESSION: 1. No acute intracranial abnormality. Stable non contrast CT appearance of the brain since 2018. 2. Chronic retained metallic foreign bodies in the left posterior scalp. Electronically Signed   By: Odessa Fleming M.D.   On: 05/26/2020 05:36    ED COURSE and MDM  Nursing notes, initial and subsequent vitals signs, including pulse oximetry, reviewed and interpreted by myself.  Vitals:   05/26/20 0630 05/26/20 0730 05/26/20 0900 05/26/20 1027  BP: (!) 172/98 (!) 159/92 (!) 157/82 (!) 155/82  Pulse: 76 74 80 77  Resp: 17 18 20 20   Temp: 98.5 F (36.9 C)     TempSrc: Oral     SpO2: 99% 95% 99% 97%  Weight:      Height:       Medications  diphenhydrAMINE (BENADRYL) injection 12.5 mg (12.5 mg Intravenous Given 05/26/20 0731)  metoCLOPramide (REGLAN) injection 5 mg (5 mg Intravenous Given 05/26/20 0734)  acetaminophen (TYLENOL) tablet 650 mg (650 mg Oral Given 05/26/20 0651)  tetracaine (PONTOCAINE) 0.5 % ophthalmic solution 2 drop (2 drops Both Eyes Given by Other 05/26/20 0848)  sodium chloride 0.9 % bolus 500 mL (0 mLs Intravenous Stopped 05/26/20 1026)  ketorolac (TORADOL) 15 MG/ML injection 15 mg (15 mg  Intravenous Given 05/26/20 0900)  metoCLOPramide (REGLAN) injection 10 mg (5 mg Intravenous Given 05/26/20 0900)  dexamethasone (DECADRON) injection 4 mg (4 mg Intravenous Given 05/26/20 0858)  haloperidol lactate (HALDOL) injection 2 mg (2 mg Intravenous Given 05/26/20 0939)   7:00 AM Signed out to Dr. 05/28/20.    PROCEDURES  Procedures   ED DIAGNOSES     ICD-10-CM   1. Bad headache  R51.9   2. 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

## 2020-05-30 ENCOUNTER — Ambulatory Visit: Payer: Medicare (Managed Care) | Admitting: Neurology

## 2020-05-30 ENCOUNTER — Encounter: Payer: Self-pay | Admitting: Neurology

## 2020-05-30 ENCOUNTER — Other Ambulatory Visit: Payer: Self-pay

## 2020-05-30 VITALS — BP 102/68 | HR 73 | Ht 64.0 in | Wt 184.6 lb

## 2020-05-30 DIAGNOSIS — G4489 Other headache syndrome: Secondary | ICD-10-CM
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
## 2020-05-30 NOTE — Patient Instructions (Signed)
Continue the diamox, and follow up with the ophthalmologist for the glaucoma.

## 2020-05-31 ENCOUNTER — Telehealth: Payer: Self-pay | Admitting: *Deleted

## 2020-05-31 LAB — C-REACTIVE PROTEIN: CRP: 1 mg/L (ref 0–10)

## 2020-05-31 LAB — SEDIMENTATION RATE: Sed Rate: 9 mm/hr (ref 0–40)

## 2020-05-31 NOTE — Telephone Encounter (Signed)
Pt's son Summer Harmon not on DPR called the office back and stated that the pt is out having eye surgery. Please advise.

## 2020-05-31 NOTE — Telephone Encounter (Signed)
I called the pt and LVM asking for call back. When she calls back, please let her know the blood work results are unremarkable and there were no concerns.

## 2020-05-31 NOTE — Telephone Encounter (Signed)
Spoke with patient and advised her labs were unremarkable, no concerns. Pt's questions were answered. She verbalized appreciation for the call.

## 2020-05-31 NOTE — Telephone Encounter (Signed)
-----   Message from York Spaniel, MD sent at 05/31/2020  8:31 AM EST -----  The blood work results are unremarkable. Please call the patient.  ----- Message ----- From: [facility] Results In Sent: 05/31/2020   7:37 AM EST To: York Spaniel, MD

## 2020-08-06 IMAGING — CT CT HEAD WITHOUT CONTRAST
3 series · 16 of 47 positions shown, 19 images · non-contrast
Comparison: None.

CLINICAL DATA: Generalized weakness.  Recent fall

EXAM:
CT HEAD WITHOUT CONTRAST
TECHNIQUE: Contiguous axial images were obtained from the base of the skull
through the vertex without intravenous contrast.

[Series 2: head wo · axial · 0.42mm/px · z∈[+1127,+1252]mm · 10 of 31 slices shown, 13 images]
[im 3/31  brain]
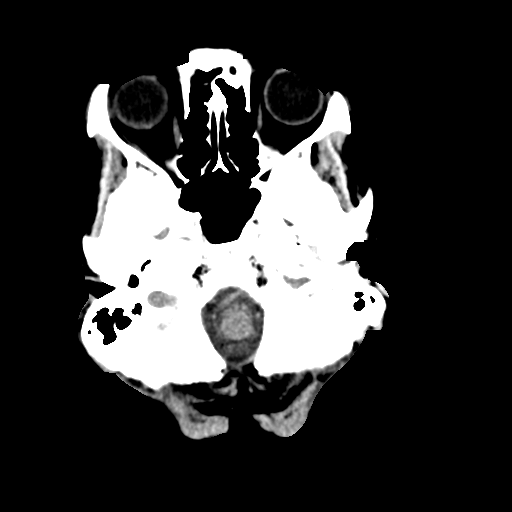
[im 3/31  bone]
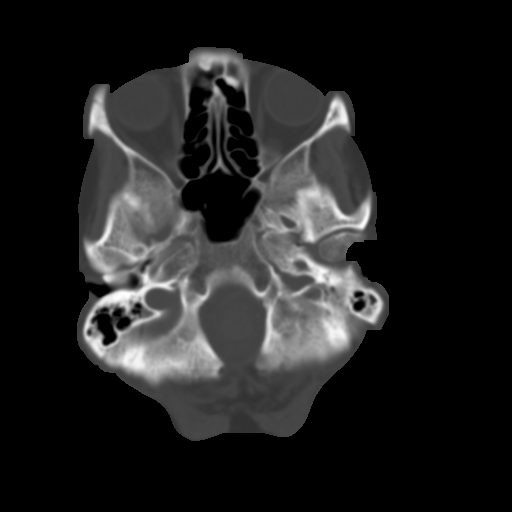
[im 6/31  brain]
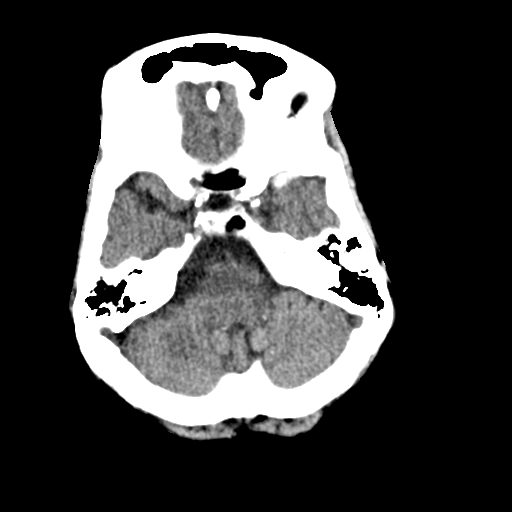
[im 9/31  brain]
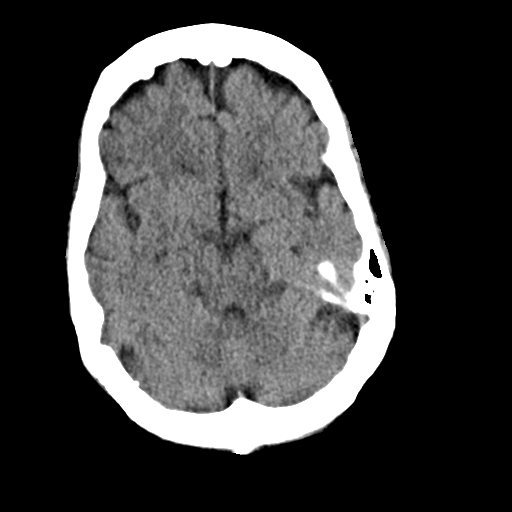
[im 11/31  brain]
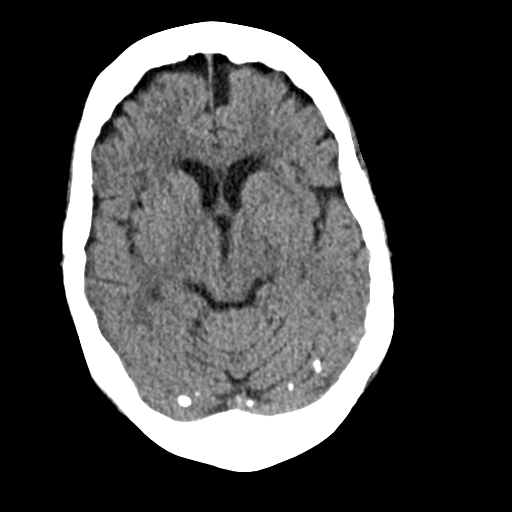
[im 14/31  brain]
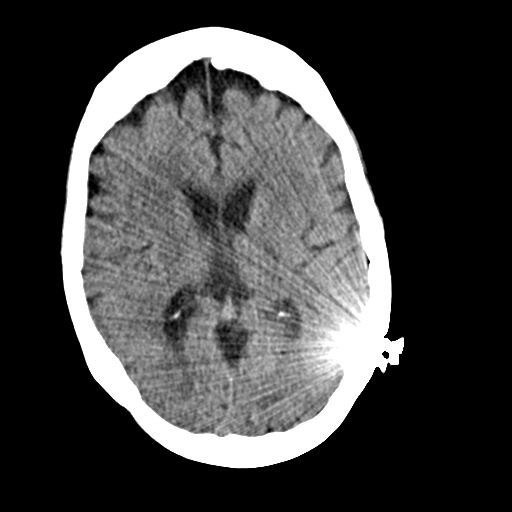
[im 14/31  bone]
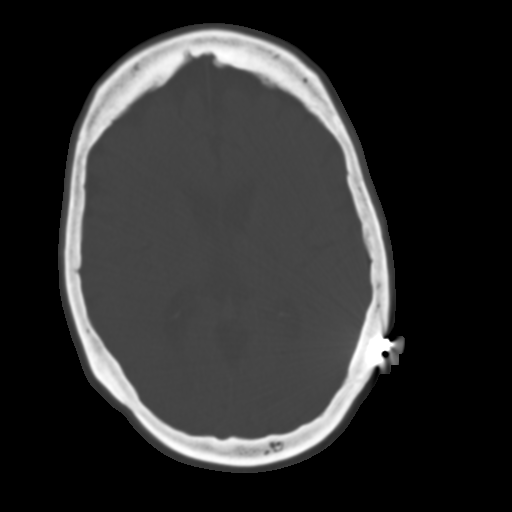
[im 17/31  brain]
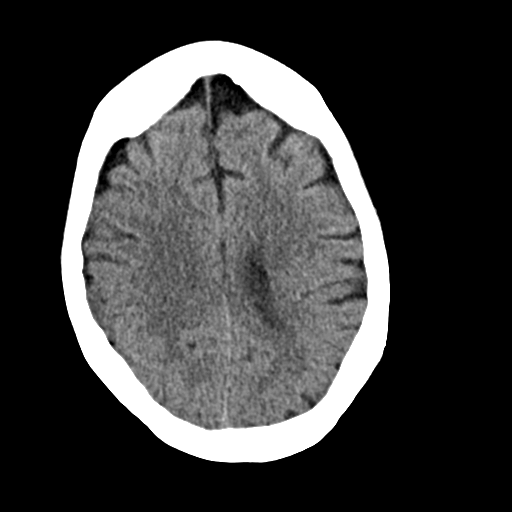
[im 20/31  brain]
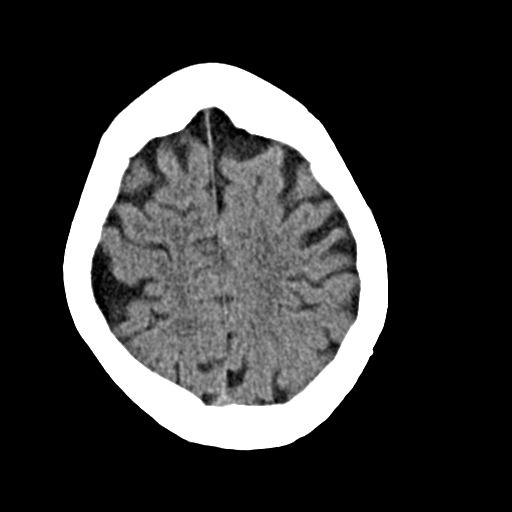
[im 23/31  brain]
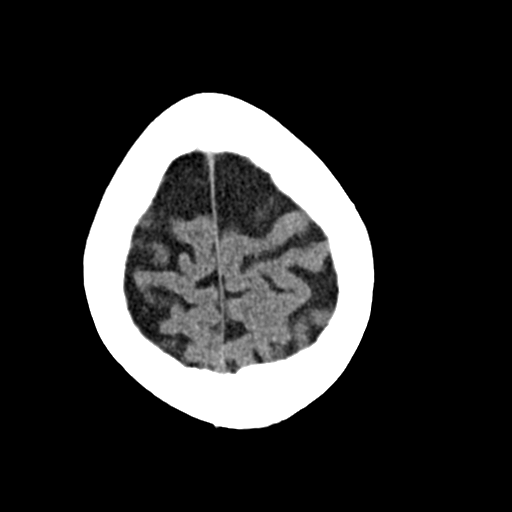
[im 25/31  brain]
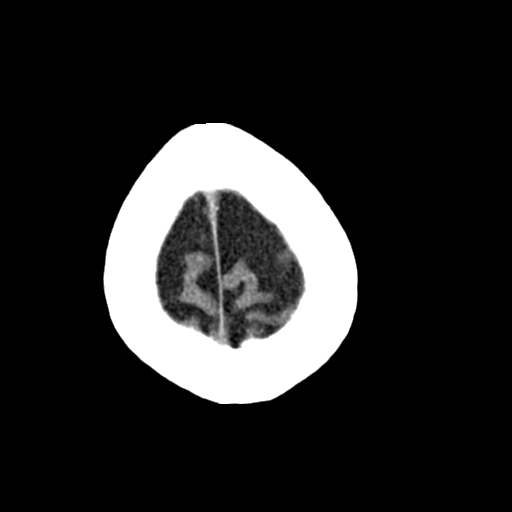
[im 25/31  bone]
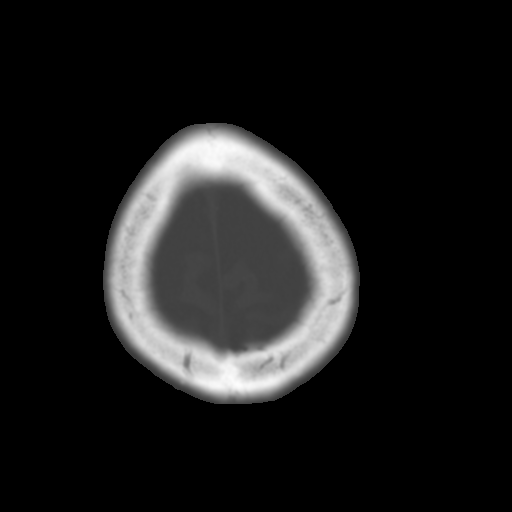
[im 28/31  brain]
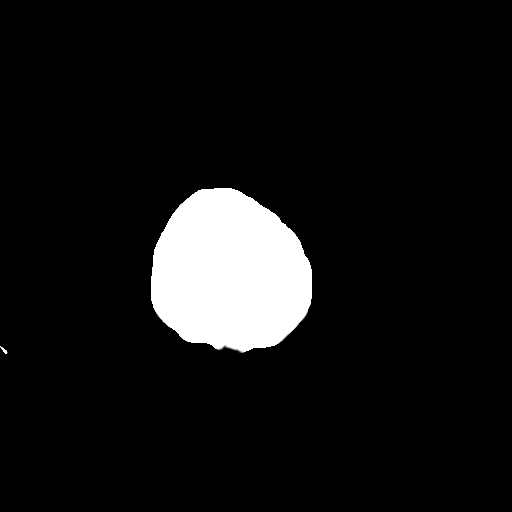

[Series 4: coronal soft · coronal · 0.30mm/px · 3 of 67 slices shown]
[im 23/67  brain]
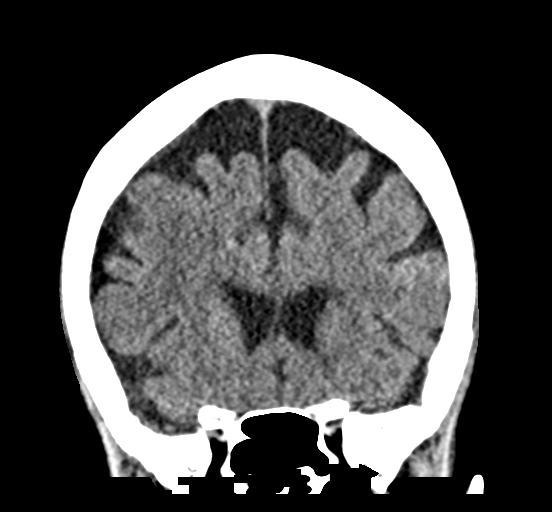
[im 30/67  brain]
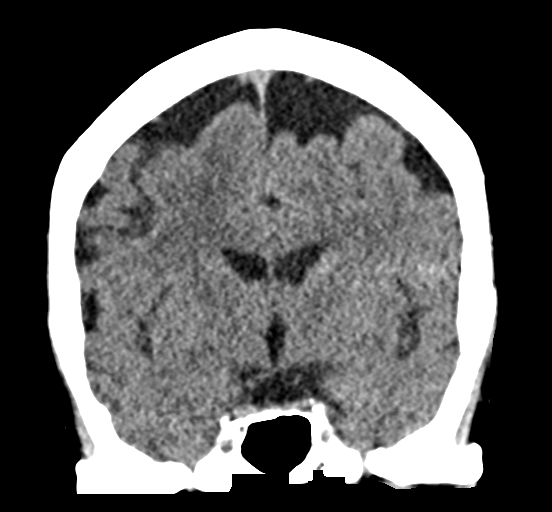
[im 37/67  brain]
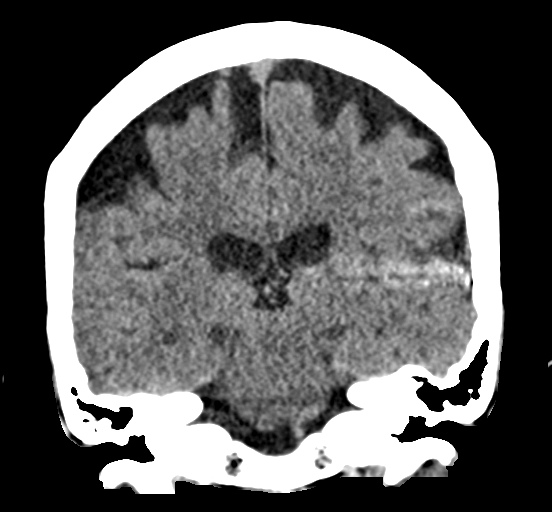

[Series 5: sag soft · sagittal · 0.29mm/px · 3 of 67 slices shown]
[im 23/67  brain]
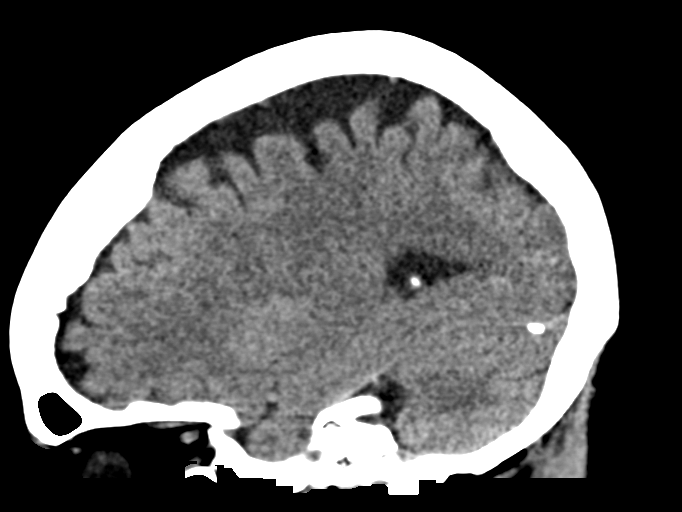
[im 34/67  brain]
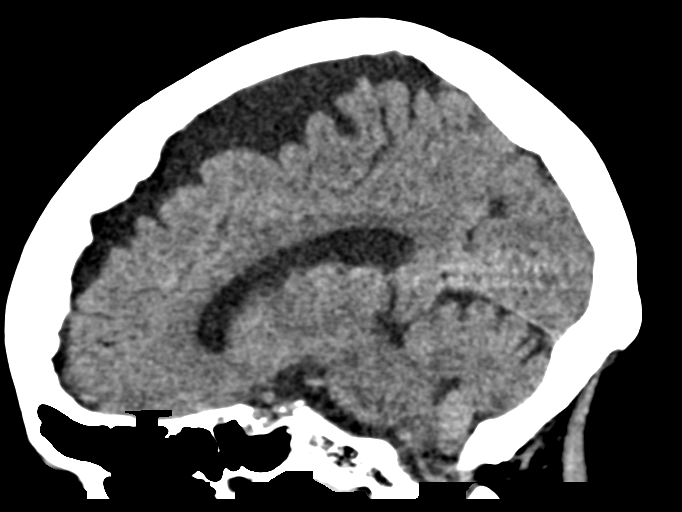
[im 45/67  brain]
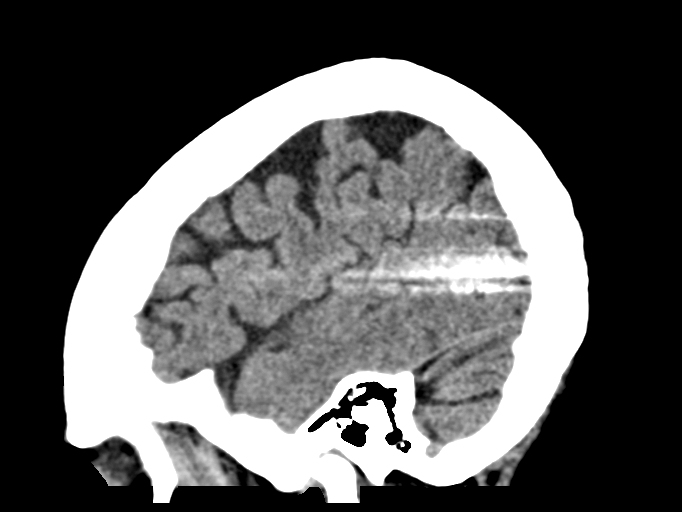

[16 of 47 positions shown; findings below may reference images not displayed]

FINDINGS: Brain: No evidence of acute infarction, hemorrhage, hydrocephalus,
extra-axial collection or mass lesion/mass effect. Generalized
atrophy in keeping with age.

Vascular: Atherosclerotic calcification

Skull: Chronic metallic fragments in the left parietal scalp and
outer table calvarium.

Sinuses/Orbits: Negative
IMPRESSION: Senescent changes without acute or focal finding.

## 2020-08-22 ENCOUNTER — Encounter: Payer: Self-pay | Admitting: Neurology

## 2020-08-22 ENCOUNTER — Ambulatory Visit: Payer: Medicare (Managed Care) | Admitting: Neurology

## 2020-08-22 ENCOUNTER — Telehealth: Payer: Self-pay | Admitting: Neurology

## 2020-08-22 NOTE — Telephone Encounter (Signed)
This patient did not show for a revisit appointment today.
